# Patient Record
Sex: Female | Born: 1950
Health system: Southern US, Community
[De-identification: ages and names within clinical notes are randomized; demographics above are authoritative.]

## PROBLEM LIST (undated history)

## (undated) DIAGNOSIS — E039 Hypothyroidism, unspecified: Secondary | ICD-10-CM

## (undated) DIAGNOSIS — F419 Anxiety disorder, unspecified: Secondary | ICD-10-CM

## (undated) DIAGNOSIS — K219 Gastro-esophageal reflux disease without esophagitis: Secondary | ICD-10-CM

## (undated) DIAGNOSIS — R0683 Snoring: Secondary | ICD-10-CM

## (undated) DIAGNOSIS — M1990 Unspecified osteoarthritis, unspecified site: Secondary | ICD-10-CM

## (undated) DIAGNOSIS — K449 Diaphragmatic hernia without obstruction or gangrene: Secondary | ICD-10-CM

## (undated) DIAGNOSIS — E042 Nontoxic multinodular goiter: Secondary | ICD-10-CM

## (undated) DIAGNOSIS — L920 Granuloma annulare: Secondary | ICD-10-CM

## (undated) DIAGNOSIS — L719 Rosacea, unspecified: Secondary | ICD-10-CM

## (undated) DIAGNOSIS — K5730 Diverticulosis of large intestine without perforation or abscess without bleeding: Secondary | ICD-10-CM

## (undated) DIAGNOSIS — K5790 Diverticulosis of intestine, part unspecified, without perforation or abscess without bleeding: Secondary | ICD-10-CM

## (undated) DIAGNOSIS — Z800 Family history of malignant neoplasm of digestive organs: Secondary | ICD-10-CM

## (undated) DIAGNOSIS — L409 Psoriasis, unspecified: Secondary | ICD-10-CM

## (undated) DIAGNOSIS — E785 Hyperlipidemia, unspecified: Secondary | ICD-10-CM

## (undated) DIAGNOSIS — M797 Fibromyalgia: Secondary | ICD-10-CM

## (undated) HISTORY — DX: Anxiety disorder, unspecified: F41.9

## (undated) HISTORY — PX: TUBAL LIGATION: SHX77

## (undated) HISTORY — DX: Family history of malignant neoplasm of digestive organs: Z80.0

## (undated) HISTORY — DX: Diverticulosis of intestine, part unspecified, without perforation or abscess without bleeding: K57.90

## (undated) HISTORY — PX: TONSILLECTOMY: SUR1361

## (undated) HISTORY — DX: Rosacea, unspecified: L71.9

## (undated) HISTORY — DX: Diaphragmatic hernia without obstruction or gangrene: K44.9

## (undated) HISTORY — DX: Granuloma annulare: L92.0

## (undated) HISTORY — PX: KNEE SURGERY: SHX244

## (undated) HISTORY — DX: Hyperlipidemia, unspecified: E78.5

## (undated) HISTORY — DX: Hypothyroidism, unspecified: E03.9

## (undated) HISTORY — DX: Nontoxic multinodular goiter: E04.2

## (undated) HISTORY — DX: Psoriasis, unspecified: L40.9

## (undated) HISTORY — PX: WRIST SURGERY: SHX841

## (undated) HISTORY — DX: Fibromyalgia: M79.7

## (undated) HISTORY — DX: Unspecified osteoarthritis, unspecified site: M19.90

## (undated) HISTORY — DX: Diverticulosis of large intestine without perforation or abscess without bleeding: K57.30

---

## 1997-10-25 ENCOUNTER — Other Ambulatory Visit: Admission: RE | Admit: 1997-10-25 | Discharge: 1997-10-25 | Payer: Self-pay | Admitting: Dermatology

## 1997-10-31 ENCOUNTER — Other Ambulatory Visit: Admission: RE | Admit: 1997-10-31 | Discharge: 1997-10-31 | Payer: Self-pay | Admitting: Dermatology

## 1998-09-24 ENCOUNTER — Other Ambulatory Visit: Admission: RE | Admit: 1998-09-24 | Discharge: 1998-09-24 | Payer: Self-pay | Admitting: Gynecology

## 1999-03-25 ENCOUNTER — Ambulatory Visit (HOSPITAL_COMMUNITY): Admission: RE | Admit: 1999-03-25 | Discharge: 1999-03-25 | Payer: Self-pay | Admitting: Gastroenterology

## 1999-03-25 ENCOUNTER — Encounter: Payer: Self-pay | Admitting: Gastroenterology

## 1999-10-17 ENCOUNTER — Other Ambulatory Visit: Admission: RE | Admit: 1999-10-17 | Discharge: 1999-10-17 | Payer: Self-pay | Admitting: Gynecology

## 2000-01-13 ENCOUNTER — Encounter: Payer: Self-pay | Admitting: Gynecology

## 2000-01-13 ENCOUNTER — Encounter: Admission: RE | Admit: 2000-01-13 | Discharge: 2000-01-13 | Payer: Self-pay | Admitting: Gynecology

## 2000-01-22 ENCOUNTER — Ambulatory Visit (HOSPITAL_COMMUNITY): Admission: RE | Admit: 2000-01-22 | Discharge: 2000-01-22 | Payer: Self-pay | Admitting: Internal Medicine

## 2000-01-22 ENCOUNTER — Encounter: Payer: Self-pay | Admitting: Internal Medicine

## 2000-04-28 ENCOUNTER — Encounter: Payer: Self-pay | Admitting: Gastroenterology

## 2000-10-26 ENCOUNTER — Ambulatory Visit (HOSPITAL_COMMUNITY): Admission: RE | Admit: 2000-10-26 | Discharge: 2000-10-26 | Payer: Self-pay | Admitting: Orthopaedic Surgery

## 2000-10-26 ENCOUNTER — Encounter: Payer: Self-pay | Admitting: Orthopaedic Surgery

## 2000-11-10 ENCOUNTER — Other Ambulatory Visit: Admission: RE | Admit: 2000-11-10 | Discharge: 2000-11-10 | Payer: Self-pay | Admitting: Gynecology

## 2000-11-12 ENCOUNTER — Encounter: Payer: Self-pay | Admitting: Gynecology

## 2000-11-12 ENCOUNTER — Ambulatory Visit (HOSPITAL_COMMUNITY): Admission: RE | Admit: 2000-11-12 | Discharge: 2000-11-12 | Payer: Self-pay | Admitting: Gynecology

## 2001-01-18 ENCOUNTER — Encounter (HOSPITAL_COMMUNITY): Admission: RE | Admit: 2001-01-18 | Discharge: 2001-02-17 | Payer: Self-pay | Admitting: Orthopedic Surgery

## 2001-02-01 ENCOUNTER — Encounter: Payer: Self-pay | Admitting: Gynecology

## 2001-02-01 ENCOUNTER — Encounter: Admission: RE | Admit: 2001-02-01 | Discharge: 2001-02-01 | Payer: Self-pay | Admitting: Gynecology

## 2001-12-23 ENCOUNTER — Other Ambulatory Visit: Admission: RE | Admit: 2001-12-23 | Discharge: 2001-12-23 | Payer: Self-pay | Admitting: Gynecology

## 2002-03-02 ENCOUNTER — Encounter: Admission: RE | Admit: 2002-03-02 | Discharge: 2002-03-02 | Payer: Self-pay | Admitting: Gynecology

## 2002-03-02 ENCOUNTER — Encounter: Payer: Self-pay | Admitting: Gynecology

## 2002-12-27 ENCOUNTER — Other Ambulatory Visit: Admission: RE | Admit: 2002-12-27 | Discharge: 2002-12-27 | Payer: Self-pay | Admitting: Gynecology

## 2003-04-26 ENCOUNTER — Encounter: Admission: RE | Admit: 2003-04-26 | Discharge: 2003-04-26 | Payer: Self-pay | Admitting: Gynecology

## 2003-12-28 ENCOUNTER — Other Ambulatory Visit: Admission: RE | Admit: 2003-12-28 | Discharge: 2003-12-28 | Payer: Self-pay | Admitting: Gynecology

## 2004-01-04 ENCOUNTER — Encounter: Admission: RE | Admit: 2004-01-04 | Discharge: 2004-01-04 | Payer: Self-pay | Admitting: Endocrinology

## 2004-02-01 ENCOUNTER — Encounter (INDEPENDENT_AMBULATORY_CARE_PROVIDER_SITE_OTHER): Payer: Self-pay | Admitting: *Deleted

## 2004-02-01 ENCOUNTER — Ambulatory Visit (HOSPITAL_COMMUNITY): Admission: RE | Admit: 2004-02-01 | Discharge: 2004-02-01 | Payer: Self-pay | Admitting: Endocrinology

## 2004-05-08 ENCOUNTER — Encounter: Admission: RE | Admit: 2004-05-08 | Discharge: 2004-05-08 | Payer: Self-pay | Admitting: Gynecology

## 2004-06-25 ENCOUNTER — Ambulatory Visit: Payer: Self-pay | Admitting: Endocrinology

## 2004-06-27 ENCOUNTER — Ambulatory Visit: Payer: Self-pay | Admitting: Endocrinology

## 2004-06-28 ENCOUNTER — Ambulatory Visit: Payer: Self-pay | Admitting: Internal Medicine

## 2004-07-17 ENCOUNTER — Ambulatory Visit: Payer: Self-pay | Admitting: Endocrinology

## 2004-11-14 ENCOUNTER — Ambulatory Visit: Payer: Self-pay | Admitting: Endocrinology

## 2004-11-18 ENCOUNTER — Ambulatory Visit: Payer: Self-pay | Admitting: Endocrinology

## 2004-12-11 ENCOUNTER — Ambulatory Visit: Payer: Self-pay | Admitting: Endocrinology

## 2004-12-18 ENCOUNTER — Ambulatory Visit (HOSPITAL_COMMUNITY): Admission: RE | Admit: 2004-12-18 | Discharge: 2004-12-18 | Payer: Self-pay | Admitting: Endocrinology

## 2004-12-20 ENCOUNTER — Ambulatory Visit: Payer: Self-pay | Admitting: Endocrinology

## 2005-01-02 ENCOUNTER — Other Ambulatory Visit: Admission: RE | Admit: 2005-01-02 | Discharge: 2005-01-02 | Payer: Self-pay | Admitting: Gynecology

## 2005-01-07 ENCOUNTER — Ambulatory Visit: Payer: Self-pay | Admitting: Gastroenterology

## 2005-01-29 ENCOUNTER — Ambulatory Visit: Payer: Self-pay | Admitting: Gastroenterology

## 2005-03-11 ENCOUNTER — Ambulatory Visit: Payer: Self-pay | Admitting: Gastroenterology

## 2005-05-02 ENCOUNTER — Emergency Department (HOSPITAL_COMMUNITY): Admission: EM | Admit: 2005-05-02 | Discharge: 2005-05-02 | Payer: Self-pay | Admitting: Emergency Medicine

## 2005-06-04 ENCOUNTER — Encounter: Admission: RE | Admit: 2005-06-04 | Discharge: 2005-06-04 | Payer: Self-pay | Admitting: Gynecology

## 2005-08-12 ENCOUNTER — Ambulatory Visit: Payer: Self-pay | Admitting: Endocrinology

## 2005-08-12 ENCOUNTER — Ambulatory Visit (HOSPITAL_COMMUNITY): Admission: RE | Admit: 2005-08-12 | Discharge: 2005-08-12 | Payer: Self-pay | Admitting: Endocrinology

## 2005-10-15 ENCOUNTER — Ambulatory Visit: Payer: Self-pay | Admitting: Endocrinology

## 2005-10-21 ENCOUNTER — Ambulatory Visit: Payer: Self-pay | Admitting: Endocrinology

## 2006-07-02 ENCOUNTER — Encounter: Admission: RE | Admit: 2006-07-02 | Discharge: 2006-07-02 | Payer: Self-pay | Admitting: Gynecology

## 2007-01-23 ENCOUNTER — Encounter: Payer: Self-pay | Admitting: Endocrinology

## 2007-01-23 DIAGNOSIS — M810 Age-related osteoporosis without current pathological fracture: Secondary | ICD-10-CM | POA: Insufficient documentation

## 2007-01-23 DIAGNOSIS — E039 Hypothyroidism, unspecified: Secondary | ICD-10-CM

## 2007-03-02 ENCOUNTER — Ambulatory Visit: Payer: Self-pay | Admitting: Gastroenterology

## 2007-04-05 ENCOUNTER — Ambulatory Visit: Payer: Self-pay | Admitting: Gastroenterology

## 2007-04-05 DIAGNOSIS — K5730 Diverticulosis of large intestine without perforation or abscess without bleeding: Secondary | ICD-10-CM | POA: Insufficient documentation

## 2007-07-05 ENCOUNTER — Encounter: Admission: RE | Admit: 2007-07-05 | Discharge: 2007-07-05 | Payer: Self-pay | Admitting: Gynecology

## 2007-08-23 DIAGNOSIS — F411 Generalized anxiety disorder: Secondary | ICD-10-CM | POA: Insufficient documentation

## 2007-08-23 DIAGNOSIS — E785 Hyperlipidemia, unspecified: Secondary | ICD-10-CM

## 2007-12-29 ENCOUNTER — Ambulatory Visit: Payer: Self-pay | Admitting: Internal Medicine

## 2007-12-29 DIAGNOSIS — S93409A Sprain of unspecified ligament of unspecified ankle, initial encounter: Secondary | ICD-10-CM | POA: Insufficient documentation

## 2008-02-15 ENCOUNTER — Ambulatory Visit: Payer: Self-pay | Admitting: Internal Medicine

## 2008-02-15 DIAGNOSIS — J40 Bronchitis, not specified as acute or chronic: Secondary | ICD-10-CM

## 2008-02-15 DIAGNOSIS — K219 Gastro-esophageal reflux disease without esophagitis: Secondary | ICD-10-CM

## 2008-05-29 ENCOUNTER — Ambulatory Visit: Payer: Self-pay | Admitting: Internal Medicine

## 2008-05-29 DIAGNOSIS — S52539A Colles' fracture of unspecified radius, initial encounter for closed fracture: Secondary | ICD-10-CM

## 2008-06-05 ENCOUNTER — Encounter: Payer: Self-pay | Admitting: Internal Medicine

## 2008-06-08 ENCOUNTER — Ambulatory Visit (HOSPITAL_COMMUNITY): Admission: RE | Admit: 2008-06-08 | Discharge: 2008-06-08 | Payer: Self-pay | Admitting: Orthopaedic Surgery

## 2008-06-16 ENCOUNTER — Encounter: Payer: Self-pay | Admitting: Internal Medicine

## 2008-06-16 ENCOUNTER — Ambulatory Visit: Payer: Self-pay | Admitting: Internal Medicine

## 2008-06-16 DIAGNOSIS — R930 Abnormal findings on diagnostic imaging of skull and head, not elsewhere classified: Secondary | ICD-10-CM | POA: Insufficient documentation

## 2008-06-16 LAB — CONVERTED CEMR LAB
Inflenza A Ag: NEGATIVE
Influenza B Ag: NEGATIVE

## 2008-06-20 ENCOUNTER — Telehealth (INDEPENDENT_AMBULATORY_CARE_PROVIDER_SITE_OTHER): Payer: Self-pay | Admitting: *Deleted

## 2008-07-17 ENCOUNTER — Encounter: Admission: RE | Admit: 2008-07-17 | Discharge: 2008-07-17 | Payer: Self-pay | Admitting: Gynecology

## 2008-07-31 ENCOUNTER — Encounter: Payer: Self-pay | Admitting: Internal Medicine

## 2008-07-31 ENCOUNTER — Ambulatory Visit: Payer: Self-pay | Admitting: Internal Medicine

## 2009-01-30 ENCOUNTER — Encounter: Payer: Self-pay | Admitting: Gastroenterology

## 2009-03-06 ENCOUNTER — Ambulatory Visit: Payer: Self-pay | Admitting: Internal Medicine

## 2009-03-07 ENCOUNTER — Encounter: Payer: Self-pay | Admitting: Internal Medicine

## 2009-06-14 ENCOUNTER — Encounter: Payer: Self-pay | Admitting: Gastroenterology

## 2009-06-21 ENCOUNTER — Ambulatory Visit: Payer: Self-pay | Admitting: Gastroenterology

## 2009-06-21 DIAGNOSIS — R740 Nonspecific elevation of levels of transaminase and lactic acid dehydrogenase [LDH]: Secondary | ICD-10-CM

## 2009-06-21 DIAGNOSIS — K760 Fatty (change of) liver, not elsewhere classified: Secondary | ICD-10-CM

## 2009-06-21 LAB — CONVERTED CEMR LAB
AFP-Tumor Marker: 4.4 ng/mL (ref 0.0–8.0)
ANA Titer 1: NEGATIVE
Anti Nuclear Antibody(ANA): POSITIVE — AB
Ceruloplasmin: 45 mg/dL (ref 21–63)
Ferritin: 114.2 ng/mL (ref 10.0–291.0)
HCV Ab: NEGATIVE
Hepatitis B Surface Ag: NEGATIVE
INR: 1 (ref 0.8–1.0)
Iron: 58 ug/dL (ref 42–145)
Prothrombin Time: 10.3 s (ref 9.1–11.7)
Saturation Ratios: 18.6 % — ABNORMAL LOW (ref 20.0–50.0)
Sed Rate: 17 mm/hr (ref 0–22)
Transferrin: 222.7 mg/dL (ref 212.0–360.0)
Vitamin B-12: 572 pg/mL (ref 211–911)

## 2009-07-23 ENCOUNTER — Encounter: Admission: RE | Admit: 2009-07-23 | Discharge: 2009-07-23 | Payer: Self-pay | Admitting: Gynecology

## 2009-09-14 ENCOUNTER — Encounter: Payer: Self-pay | Admitting: Gastroenterology

## 2010-06-29 ENCOUNTER — Other Ambulatory Visit: Payer: Self-pay | Admitting: Gynecology

## 2010-06-29 DIAGNOSIS — Z1239 Encounter for other screening for malignant neoplasm of breast: Secondary | ICD-10-CM

## 2010-07-09 NOTE — Assessment & Plan Note (Signed)
Summary: INCREASED LIVER FUNCTION TEST DUE TO CRESTOR/YF    History of Present Illness Visit Type: consult  Primary GI MD: Sheryn Bison MD FACP FAGA Primary Provider: Herb Grays, MD  Requesting Provider: Herb Grays, MD  Chief Complaint: Increased LFT  History of Present Illness:   This patient is a 60 year old white female with a known benign liver hemangioma followed the last several years with CT scans. She has had normal liver function tests until this past summer when her liver enzymes were approximately twice normal. Crestor was discontinued and she continues to have liver transaminases twice normal with normal bilirubin and alkaline phosphatase. Repeat ultrasound was performed in August which showed hepatic steatosis and a stable hepatic hemangioma. The gallbladder appeared normal. has a variety of skin lesions including granuloma annulare and psoriasis. Review of liver function tests over the last 10 years she has no abnormalities. Previous evaluation for Lynch syndrome was negative, she is HLA-B. 27 negative and had a negative rheumatoid factor and ANA. She does have a very positive family history of colon cancer. Her previous workups are negative for coronary artery disease. She does have a history of a multinodular goiter. Workup for hyperparathyroidism has apparently been negative.  The patient denies any history of liver disease or hepatitis or familial liver or GI problems. She does suffer from obesity all her life and has regained 40 pounds in weight over the last year. She has borderline glucose intolerance, hyperlipidemia, chronic lower dysfunction, chronic GERD requiring PPI therapy, and she takes daily Lasix and Valtrex. She denies right upper quadrant pain, nausea and vomiting, or any lower GI complaints. She is up-to-date on colonoscopy exams. She does not abuse ethanol. She specifically denies pruritus, chronic fatigue, mental status changes, abdominal swelling or edema. She  gives no history of previous hepatitis or pancreatitis.   GI Review of Systems    Reports weight gain.      Denies abdominal pain, acid reflux, belching, bloating, chest pain, dysphagia with liquids, dysphagia with solids, heartburn, loss of appetite, nausea, vomiting, vomiting blood, and  weight loss.        Denies anal fissure, black tarry stools, change in bowel habit, constipation, diarrhea, diverticulosis, fecal incontinence, heme positive stool, hemorrhoids, irritable bowel syndrome, jaundice, light color stool, liver problems, rectal bleeding, and  rectal pain.    Current Medications (verified): 1)  Synthroid 88 Mcg  Tabs (Levothyroxine Sodium) .... Take 1 By Mouth Qd 2)  Adult Aspirin Low Strength 81 Mg  Tbdp (Aspirin) .... Take 1 By Mouth Qd 3)  Buspar 15 Mg  Tabs (Buspirone Hcl) .... Take 1 By Mouth Two Times A Day Qd 4)  Calcium 500/d 500-200 Mg-Unit  Tabs (Calcium Carbonate-Vitamin D) .... Take 1 By Mouth Qd 5)  Multivitamins   Tabs (Multiple Vitamin) .... Take 1 By Mouth Qd 6)  Folic Acid 1 Mg  Tabs (Folic Acid) .... Take 1 By Mouth Qd 7)  Protonix 40 Mg  Tbec (Pantoprazole Sodium) .... Take 1 By Mouth Two Times A Day Qd 8)  Lasix 20 Mg  Tabs (Furosemide) .... Take 1 By Mouth Once Daily As Needed 9)  Valtrex 1 Gm  Tabs (Valacyclovir Hcl) .... Take 1 By Mouth Once Daily Prn  Allergies (verified): 1)  ! Relafen 2)  ! Erythromycin  Past History:  Past medical, surgical, family and social histories (including risk factors) reviewed for relevance to current acute and chronic problems.  Past Medical History: Reviewed history from 06/16/2008 and no changes  required. Hypothyroidism (1993) Osteoporosis (2002) Rosacea (2002) Annulary Granuloma (2002) Psoriasis (2002) Dyslipidemia Multinodular Goiter Fibromyalgia Anxiety  Past Surgical History: Reviewed history from 08/23/2007 and no changes required. EGD (01/29/2005) Echocardiogram (01/31/2004) Rest/Stress Cardiolite  (01/31/2004) EKG (02/06/2004) Tubal ligation Knee surgery  Family History: Reviewed history and no changes required. Family History of Colon Cancer:Father   Social History: Reviewed history from 06/16/2008 and no changes required. Occupation: [employer] Never Smoked Alcohol use-no Drug use-no Regular exercise-no  Review of Systems  The patient denies allergy/sinus, anemia, anxiety-new, arthritis/joint pain, back pain, blood in urine, breast changes/lumps, change in vision, confusion, cough, coughing up blood, depression-new, fainting, fatigue, fever, headaches-new, hearing problems, heart murmur, heart rhythm changes, itching, menstrual pain, muscle pains/cramps, night sweats, nosebleeds, pregnancy symptoms, shortness of breath, skin rash, sleeping problems, sore throat, swelling of feet/legs, swollen lymph glands, thirst - excessive , urination - excessive , urination changes/pain, urine leakage, vision changes, and voice change.    Vital Signs:  Patient profile:   60 year old female Height:      68 inches Weight:      253 pounds BMI:     38.61 BSA:     2.26 Pulse rate:   68 / minute Pulse rhythm:   regular BP sitting:   128 / 80  (left arm) Cuff size:   regular  Vitals Entered By: Ok Anis CMA (June 21, 2009 11:11 AM)  Physical Exam  General:  Well developed, well nourished, no acute distress.healthy appearing and obese.   Head:  Normocephalic and atraumatic. Eyes:  PERRLA, no icterus.exam deferred to patient's ophthalmologist.   Lungs:  Clear throughout to auscultation. Heart:  Regular rate and rhythm; no murmurs, rubs,  or bruits. Abdomen:  Soft, nontender and nondistended. No masses, hepatosplenomegaly or hernias noted. Normal bowel sounds.obese.  There is no hepatomegaly noted to percussion and palpitation. Cannot appreciate stigmata of chronic liver disease. She does have a skin rash of granuloma annulare over entire trunk area. Rectal:  deferred Extremities:  No  clubbing, cyanosis, edema or deformities noted. Neurologic:  Alert and  oriented x4;  grossly normal neurologically. Skin:  maculopapular rash:Marland Kitchen   Psych:  Alert and cooperative. Normal mood and affect.   Impression & Recommendations:  Problem # 1:  FATTY LIVER DISEASE (ICD-571.8) Assessment Deteriorated Labs ordered to exclude other metabolic causes of liver disease. 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 I placed her on Actos 30 mg a day along with vitamin E 400 units a day along with reinforcement per a gradual low-calorie, low carbohydrate weight loss program with aerobic exercise. I will see her back in 2 months time with liver profile before her visit. We will hold her Crestor for now. I've given her patient education material concerning fatty liver and its management. Depending on her workup and clinical course she may need percutaneous liver biopsy. I reviewed her recent ultrasound report and her hepatic hemangioma is small and stable. Alpha-fetoprotein level has been ordered. TLB-B12, Serum-Total ONLY ([address]-U04) TLB-Ferritin (82728-FER) TLB-Iron, (Fe) Total (83540-FE) TLB-IBC Pnl (Iron/FE;Transferrin) (83550-IBC) TLB-Sedimentation Rate (ESR) (85652-ESR) TLB-PT (Protime) (85610-PTP) T-AMA (54098-11914) T-ANA (78295-62130) T-Ceruloplasmin [phone]) T-Hepatitis B Surface Antigen (95284-13244) T-Hepatitis C Anti HCV (01027) T-Alpha-Fetoprotein Serum (25366-44034)  Problem # 2:  ADENOCARCINOMA, COLON, FAMILY HX (ICD-V16.0) Assessment: Unchanged followup as per clinical protocol  Problem # 3:  GERD (ICD-530.81) Assessment: Improved continue reflex regime and daily PPI therapy.  Patient Instructions: 1)  Copy sent  to : Dr. Corrin [name] and Dr. Herb Grays 2)  Please continue current medications.  3)  Please schedule a follow-up appointment in 2 months. 4)  Liver profile before next clinic  visit. 5)  Rx sent to pharmacy for Actos 30 mg qd.   6)  Pt will buy Vit E OTC. 7)  The medication list was reviewed and reconciled.  All changed / newly prescribed medications were explained.  A complete medication list was provided to the patient / caregiver. Prescriptions: ACTOS 30 MG TABS (PIOGLITAZONE HCL) 1 by mouth q am  #30 x 6   Entered by:   Ashok Cordia RN   Authorized by:   Mardella Layman MD [facility]   Signed by:   Ashok Cordia RN on 06/21/2009   Method used:   Electronically to        [facility]* (retail)       [address]       [address] Shipping/mailing       [place], [place]  [address]       Ph: [phone]       Fax: [phone]   RxID:   [id number]

## 2010-07-29 ENCOUNTER — Ambulatory Visit
Admission: RE | Admit: 2010-07-29 | Discharge: 2010-07-29 | Disposition: A | Discharge status: Home / Self Care | Payer: Commercial Managed Care - PPO | Source: Ambulatory Visit | Attending: Gynecology | Admitting: Gynecology

## 2010-07-29 DIAGNOSIS — Z1239 Encounter for other screening for malignant neoplasm of breast: Secondary | ICD-10-CM

## 2010-10-22 NOTE — Assessment & Plan Note (Signed)
[facility]                         GASTROENTEROLOGY OFFICE NOTE   NAME:[name], [name] BACCARI                       MRN:          [id number]  DATE:03/02/2007                            DOB:          [birth year]    [name] [name] is referred by Dr. Collins Scotland for evaluation of abnormal CT scan of the  abdomen performed on January 29, 2007 because of right lower quadrant  discomfort.  This showed a 3.8 x 3.7 x 3 cm benign hemangioma with  otherwise normal CT scan of the abdomen and pelvis.   [name] [name] has had a musculoskeletal type of right lower quadrant pain for  the last several weeks without real change in her bowel habits.  She has  been chronically constipated and has to use laxatives.  She denies  melena, hematochezia, or any hepatobiliary or upper gastrointestinal  complaints.  I have seen her in the past because of acid reflux, and she  had endoscopy in 2005-01-26 and colonoscopy in 01/27/2003.  Her  father died of colon cancer at age 28 and her grandmother at age 51.   Her past medical history is remarkable for removal of a previous fatty  tumor of her back by Dr. Mitzi Davenport.  She is followed by Dr. 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  She  additionally suffers from hyperlipidemia, chronic thyroid disorder, and  GERD, as mentioned above.  She has had 9 cardiac chest pain with a  previous negative cardiac workup, including stress Cardiolite, most  recently in 2005.  She has been screened in the past for hereditary  nonpolyposis colorectal cancer, and this was negative in 2005.  She also  in the past on reviewing her chart is suffering from plantar fascitis  and sees Dr. Norlene Campbell.   She is on a variety of medications that are listed and reviewed in her  chart, including Protonix, Synthroid, aspirin, Crestor, and BuSpar,  along with Metamucil and a variety of dietary supplements.  Her  Synthroid is 0.88 mcg a day.   She  is followed, rheumatology-wise, by Dr. Corliss Skains and sees Dr. Shelby Mattocks in dermatology, and Dr. Greta Doom for GYN.   On review of her chart, I cannot find the previous ultrasound or CT  scans of her abdomen in her record.  She relates that she recently saw  Dr. Collins Scotland and had normal liver function tests and is due for follow-up  lab data this coming week.   PHYSICAL EXAMINATION:  Exam shows her to be a middle-aged white female  appearing her stated age in no acute distress.  She weighs 228 pounds.  Blood pressure is 112/62, pulse 68 and regular.  I could not appreciate any hepatosplenomegaly, abdominal masses, or  localized areas of tenderness.  Bowel sounds were normal.  RECTAL:  Not performed.   ASSESSMENT:  1. [name] [name] has chronic functional constipation and vague right      lower quadrant discomfort, which really does sound more      musculoskeletal than bowel-related; however, she has a strong  family history of colon carcinoma, and she is due for follow-up      exam.  2. Benign hepatic hemangioma, which I do not think requires further      evaluation at this time but would repeat ultrasound or CT exam in      six months.  If not done, liver function tests need to be      completed.  3. Multiple medical problems, as mentioned above:  Managed by Dr.      Collins Scotland.   RECOMMENDATIONS:  1. A trial of Amitiza 8 mcg twice daily for constipation.  2. Follow-up colonoscopy exam.  3. Continue other multiple medications, as listed above.     Vania Rea. Jarold Motto, MD, [name], FAGA  Electronically Signed    DRP/MedQ  DD: 03/02/2007  DT: 03/03/2007  Job #: [id number]   cc:   Tammy R. Collins Scotland, M.D.

## 2010-10-23 IMAGING — CR DG CHEST 2V
2 series · 2 of 2 positions shown · non-contrast
Comparison: Digitized films from 09/14/01.

CLINICAL DATA: Cough.

CHEST - 2 VIEW

[view not recorded (1 of 2)]
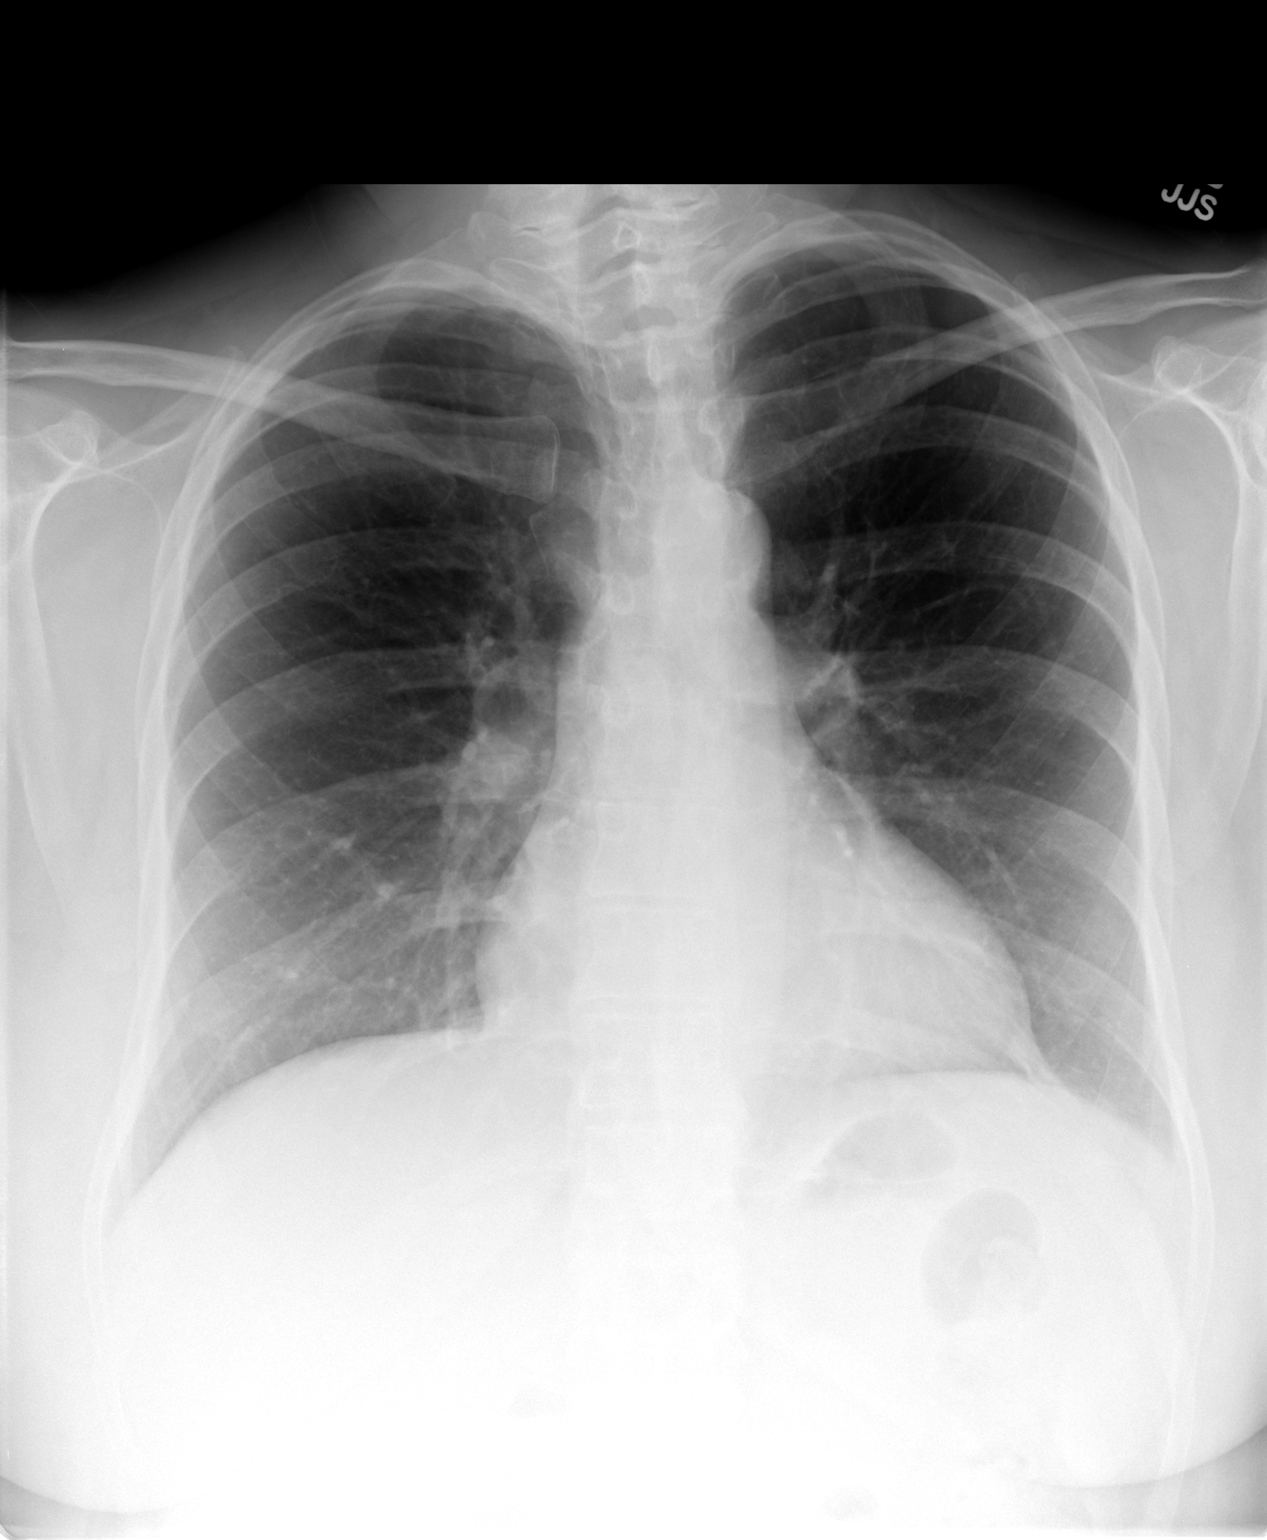

[view not recorded (2 of 2)]
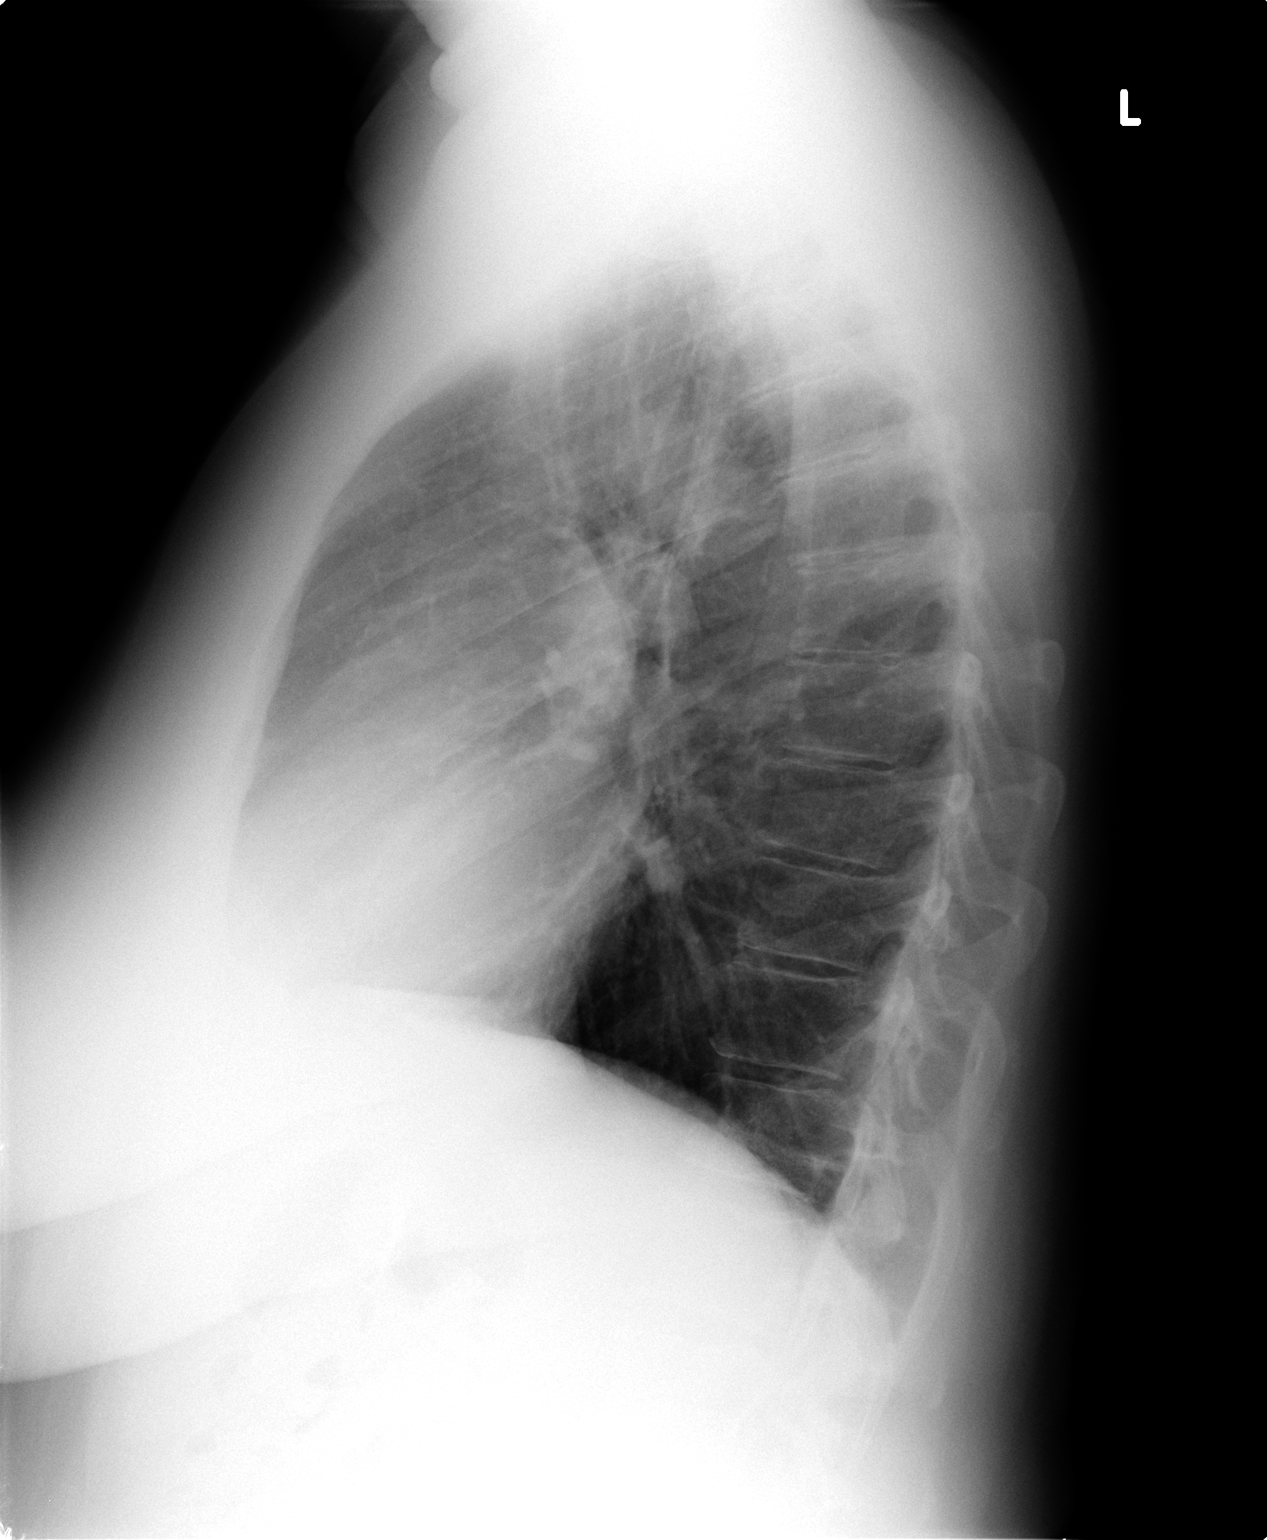

[2 of 2 positions shown; findings below may reference images not displayed]

FINDINGS: The lungs are clear without focal infiltrate, edema,
pneumothorax or pleural effusion. The cardiopericardial silhouette
is within normal limits for size. Imaged bony structures of the
thorax are intact.
IMPRESSION: No acute cardiopulmonary process.

## 2011-02-21 ENCOUNTER — Ambulatory Visit: Payer: Commercial Managed Care - PPO | Admitting: Internal Medicine

## 2011-02-26 ENCOUNTER — Ambulatory Visit: Payer: Commercial Managed Care - PPO | Admitting: Internal Medicine

## 2011-05-02 ENCOUNTER — Ambulatory Visit (INDEPENDENT_AMBULATORY_CARE_PROVIDER_SITE_OTHER): Payer: Commercial Managed Care - PPO | Admitting: Internal Medicine

## 2011-05-02 VITALS — BP 136/82 | HR 85 | Temp 98.6°F | Wt 239.0 lb

## 2011-05-02 DIAGNOSIS — J069 Acute upper respiratory infection, unspecified: Secondary | ICD-10-CM

## 2011-05-02 MED ORDER — AZITHROMYCIN 250 MG PO TABS: ORAL_TABLET | ORAL | Status: AC

## 2011-05-02 MED ORDER — PROMETHAZINE-CODEINE 6.25-10 MG/5ML PO SYRP: 5.0000 mL | ORAL_SOLUTION | ORAL | Status: AC | PRN

## 2011-05-02 NOTE — Progress Notes (Signed)
  Subjective:    Patient ID: [name] [name], female    DOB: 1950/07/26, 60 y.o.   MRN: [id number]  HPI   HPI  C/o URI sx's x 2   days. C/o ST, cough, weakness. Not better with OTC medicines. Actually, the patient is getting worse. The patient did not sleep last night due to cough.  Review of Systems  Constitutional: Positive for fever, chills and fatigue.  HENT: Positive for congestion, rhinorrhea, sneezing and postnasal drip.   Eyes: Positive for photophobia and pain. Negative for discharge and visual disturbance.  Respiratory: Positive for cough and Gastrointestinal: Negative for vomiting, abdominal pain, diarrhea and abdominal distention.  Genitourinary: Negative for dysuria and difficulty urinating.  Skin: Negative for rash.  Neurological: Positive for dizziness, weakness and light-headedness.      Review of Systems     Objective:   Physical Exam  Constitutional: She appears well-developed. No distress.  HENT:  Head: Normocephalic.  Right Ear: External ear normal.  Left Ear: External ear normal.  Nose: Nose normal.       eryth throat  Eyes: Conjunctivae are normal. Pupils are equal, round, and reactive to light. Right eye exhibits no discharge. Left eye exhibits no discharge.  Neck: Normal range of motion. Neck supple. No JVD present. No tracheal deviation present. No thyromegaly present.  Cardiovascular: Normal rate, regular rhythm and normal heart sounds.   Pulmonary/Chest: No stridor. No respiratory distress. She has no wheezes.  Abdominal: Soft. Bowel sounds are normal. She exhibits no distension and no mass. There is no tenderness. There is no rebound and no guarding.  Musculoskeletal: She exhibits no edema and no tenderness.  Lymphadenopathy:    She has no cervical adenopathy.  Neurological: She displays normal reflexes. No cranial nerve deficit. She exhibits normal muscle tone. Coordination normal.  Skin: No rash noted. No erythema.  Psychiatric: She has a normal  mood and affect. Her behavior is normal. Judgment and thought content normal.          Assessment & Plan:

## 2011-05-02 NOTE — Assessment & Plan Note (Signed)
See Meds: take Zpac if worse

## 2011-05-06 ENCOUNTER — Ambulatory Visit: Payer: Commercial Managed Care - PPO | Admitting: Gastroenterology

## 2011-06-10 HISTORY — PX: UPPER GASTROINTESTINAL ENDOSCOPY: SHX188

## 2011-06-10 HISTORY — PX: COLONOSCOPY: SHX174

## 2011-06-25 ENCOUNTER — Other Ambulatory Visit: Payer: Self-pay | Admitting: Gynecology

## 2011-06-25 DIAGNOSIS — Z1231 Encounter for screening mammogram for malignant neoplasm of breast: Secondary | ICD-10-CM

## 2011-07-08 ENCOUNTER — Other Ambulatory Visit: Payer: Self-pay | Admitting: Dermatology

## 2011-08-04 ENCOUNTER — Ambulatory Visit
Admission: RE | Admit: 2011-08-04 | Discharge: 2011-08-04 | Disposition: A | Discharge status: Home / Self Care | Payer: 59 | Source: Ambulatory Visit | Attending: Gynecology | Admitting: Gynecology

## 2011-08-04 DIAGNOSIS — Z1231 Encounter for screening mammogram for malignant neoplasm of breast: Secondary | ICD-10-CM

## 2011-11-05 ENCOUNTER — Encounter: Payer: Self-pay | Admitting: *Deleted

## 2011-11-11 ENCOUNTER — Encounter: Payer: Self-pay | Admitting: Gastroenterology

## 2011-11-11 ENCOUNTER — Ambulatory Visit (INDEPENDENT_AMBULATORY_CARE_PROVIDER_SITE_OTHER): Payer: 59 | Admitting: Gastroenterology

## 2011-11-11 VITALS — BP 100/72 | HR 76 | Ht 68.0 in | Wt 245.1 lb

## 2011-11-11 DIAGNOSIS — Z800 Family history of malignant neoplasm of digestive organs: Secondary | ICD-10-CM

## 2011-11-11 DIAGNOSIS — L409 Psoriasis, unspecified: Secondary | ICD-10-CM

## 2011-11-11 DIAGNOSIS — K7689 Other specified diseases of liver: Secondary | ICD-10-CM

## 2011-11-11 DIAGNOSIS — L408 Other psoriasis: Secondary | ICD-10-CM

## 2011-11-11 DIAGNOSIS — K760 Fatty (change of) liver, not elsewhere classified: Secondary | ICD-10-CM

## 2011-11-11 DIAGNOSIS — K219 Gastro-esophageal reflux disease without esophagitis: Secondary | ICD-10-CM

## 2011-11-11 DIAGNOSIS — K5730 Diverticulosis of large intestine without perforation or abscess without bleeding: Secondary | ICD-10-CM

## 2011-11-11 MED ORDER — MOVIPREP 100 G PO SOLR: 1.0000 | Freq: Once | ORAL | Status: DC

## 2011-11-11 NOTE — Patient Instructions (Signed)
Your procedure has been scheduled for 01/02/2012, please follow the seperate instructions.  Your prescription(s) have been sent to you pharmacy.  Please go to the basement today for your labs.

## 2011-11-11 NOTE — Progress Notes (Signed)
History of Present Illness:  This is a 61 year old Caucasian female with psoriasis, fatty liver, chronic GERD, and a strong family history of colon cancer. She is due for followup endoscopy and colonoscopy. The patient been on Prilosec 40 mg a day with fairly good control of her acid reflux symptoms. She has been treated for H. pylori in January of last year by Dr. Collins Scotland. Patient has a history of psoriasis, and apparently was contemplating Humira therapy, but her psoriasis has apparently resolved over the last year spontaneously. Repeat liver function test were reviewed today and have become normal, she has a normal sed rate, rheumatoid panel, and CBC. She has regular bowel movements without melena or hematochezia. Review of her radiographs show a benign liver hemangioma, fatty infiltration, and other causes of liver disease had been tested and have been negative with a normal serum alpha-fetoprotein level. The patient denies right upper quadrant pain or any hepatobiliary complaints at this time. Her appetite is good and her weight has been stable. She is to for hyporthyroidism ,and is chronically on Valtrex.  I have reviewed this patient's present history, medical and surgical past history, allergies and medications.     ROS: The remainder of the 10 point ROS is negative     Physical Exam: Blood pressure 100/72, pulse 76, weight 245 pounds the BMI of 37.27. I cannot appreciate stigmata of chronic liver disease. General well developed well nourished patient in no acute distress, appearing their stated age Eyes PERRLA, no icterus, fundoscopic exam per opthamologist Skin no lesions noted, some papular lesions on her left arm area, or certainly no diffuse psoriasis. Neck supple, no adenopathy, no thyroid enlargement, no tenderness Chest clear to percussion and auscultation Heart no significant murmurs, gallops or rubs noted Abdomen no hepatosplenomegaly masses or tenderness, BS normal.  Extremities  no acute joint lesions, edema, phlebitis or evidence of cellulitis. Neurologic patient oriented x 3, cranial nerves intact, no focal neurologic deficits noted. Psychological mental status normal and normal affect.  Assessment and plan: Chronic GERD doing well on PPI therapy. She desires endoscopy exclude Barrett's mucosa, and I have scheduled this time her for followup colonoscopy. Both her grandmother and her mother apparently had colon cancer at age 44. Previous genetic studies for hereditary colon cancer syndrome and was negative. I have ordered repeat serum alpha-fetoprotein level. This patient extremely concerned about her health, and seems very anxious and concerned about the possibility of occult malignancy. I reviewed anti-reflux regime with her, we'll continue her Prilosec, and high fiber diet as tolerated. Review of her numerous labs, records, and radiographs was extremely time consuming. Please copy this note to Dr. Herb Grays.  Encounter Diagnoses  Name Primary?  . Family history of malignant neoplasm of gastrointestinal tract Yes  . Esophageal reflux   . Diverticulosis of colon (without mention of hemorrhage)

## 2011-11-12 ENCOUNTER — Other Ambulatory Visit: Payer: 59

## 2011-11-12 DIAGNOSIS — K219 Gastro-esophageal reflux disease without esophagitis: Secondary | ICD-10-CM

## 2011-11-12 DIAGNOSIS — Z800 Family history of malignant neoplasm of digestive organs: Secondary | ICD-10-CM

## 2011-11-12 DIAGNOSIS — K5730 Diverticulosis of large intestine without perforation or abscess without bleeding: Secondary | ICD-10-CM

## 2011-11-20 ENCOUNTER — Other Ambulatory Visit: Payer: Self-pay

## 2011-12-15 ENCOUNTER — Ambulatory Visit (INDEPENDENT_AMBULATORY_CARE_PROVIDER_SITE_OTHER): Payer: 59 | Admitting: Internal Medicine

## 2011-12-15 ENCOUNTER — Encounter: Payer: Self-pay | Admitting: Internal Medicine

## 2011-12-15 VITALS — BP 130/72 | HR 67 | Temp 97.4°F | Ht 69.0 in | Wt 247.0 lb

## 2011-12-15 DIAGNOSIS — L920 Granuloma annulare: Secondary | ICD-10-CM | POA: Insufficient documentation

## 2011-12-15 DIAGNOSIS — R21 Rash and other nonspecific skin eruption: Secondary | ICD-10-CM

## 2011-12-15 DIAGNOSIS — L538 Other specified erythematous conditions: Secondary | ICD-10-CM

## 2011-12-15 DIAGNOSIS — L409 Psoriasis, unspecified: Secondary | ICD-10-CM

## 2011-12-15 DIAGNOSIS — L408 Other psoriasis: Secondary | ICD-10-CM

## 2011-12-15 DIAGNOSIS — R609 Edema, unspecified: Secondary | ICD-10-CM

## 2011-12-15 MED ORDER — FUROSEMIDE 20 MG PO TABS: 10.0000 mg | ORAL_TABLET | Freq: Every day | ORAL | Status: AC | PRN

## 2011-12-15 NOTE — Patient Instructions (Signed)
It was good to see you today. Use Lasix for next 3 days then as needed for swelling- Your prescription(s) have been submitted to your pharmacy. Please take as directed and contact our office if you believe you are having problem(s) with the medication(s). Keep feet elevated as able and call if increasing rash or other changes

## 2011-12-15 NOTE — Progress Notes (Signed)
  Subjective:    Patient ID: [name] [name], female    DOB: [birth year], 61 y.o.   MRN: [id number]  HPI  complains of rash Onset 3 days ago associated with intense but temporary swelling B feet Not associated with itch or blistering, not painful No other body affected with this rash Different than AG or psoriasis rash  Past Medical History  Diagnosis Date  . Hypothyroidism   . Osteoporosis   . Rosacea   . Granuloma annulare   . Psoriasis   . Dyslipidemia   . Multinodular goiter   . Fibromyalgia   . Anxiety   . Diverticulosis of colon (without mention of hemorrhage)   . Family history of malignant neoplasm of gastrointestinal tract      Review of Systems  Constitutional: Negative for fever, fatigue and unexpected weight change.  Respiratory: Negative for cough and shortness of breath.   Cardiovascular: Positive for leg swelling. Negative for chest pain and palpitations.  Hematological: Does not bruise/bleed easily.       Objective:   Physical Exam BP 130/72  Pulse 67  Temp 97.4 F (36.3 C) (Oral)  Ht 5\' 9"  (1.753 m)  Wt 247 lb (112.038 kg)  BMI 36.48 kg/m2  SpO2 96% Constitutional: She is overweight, nontoxic - appears well-developed and well-nourished. No distress.  Cardiovascular: Normal rate, regular rhythm and normal heart sounds.  No murmur heard. No BLE edema. Skin: scattered petechia distal BLE ob posterior calf/ankle, sparing feet - none above mid calf - other skin is warm and dry. AG rash noted on feet and psoriasis on BUE. No erythema.  Psychiatric: She has a normal mood and affect. Her behavior is normal. Judgment and thought content normal.    No results found for this basename: WBC, HGB, HCT, MCV, PLT       Assessment & Plan:  BLE edema, dependant with mild capillary damage  Resume lasix qd x 3days, then prn Reassurance provided

## 2011-12-25 LAB — HM COLONOSCOPY

## 2012-01-02 ENCOUNTER — Ambulatory Visit (AMBULATORY_SURGERY_CENTER): Payer: 59 | Admitting: Gastroenterology

## 2012-01-02 ENCOUNTER — Encounter: Payer: Self-pay | Admitting: Gastroenterology

## 2012-01-02 VITALS — BP 125/73 | HR 61 | Temp 98.6°F | Resp 17 | Ht 68.0 in | Wt 245.0 lb

## 2012-01-02 DIAGNOSIS — K5730 Diverticulosis of large intestine without perforation or abscess without bleeding: Secondary | ICD-10-CM

## 2012-01-02 DIAGNOSIS — D128 Benign neoplasm of rectum: Secondary | ICD-10-CM

## 2012-01-02 DIAGNOSIS — D129 Benign neoplasm of anus and anal canal: Secondary | ICD-10-CM

## 2012-01-02 DIAGNOSIS — K219 Gastro-esophageal reflux disease without esophagitis: Secondary | ICD-10-CM

## 2012-01-02 DIAGNOSIS — D126 Benign neoplasm of colon, unspecified: Secondary | ICD-10-CM

## 2012-01-02 DIAGNOSIS — Z800 Family history of malignant neoplasm of digestive organs: Secondary | ICD-10-CM

## 2012-01-02 MED ORDER — SODIUM CHLORIDE 0.9 % IV SOLN: 500.0000 mL | INTRAVENOUS | Status: DC

## 2012-01-02 NOTE — Progress Notes (Signed)
Patient did not experience any of the following events: a burn prior to discharge; a fall within the facility; wrong site/side/patient/procedure/implant event; or a hospital transfer or hospital admission upon discharge from the facility. (G8907) Patient did not have preoperative order for IV antibiotic SSI prophylaxis. (G8918)  

## 2012-01-02 NOTE — Op Note (Signed)
[facility] [address] [place], [place]  [address]  ENDOSCOPY PROCEDURE REPORT  PATIENT:  [name] [name], [name] [name]  MR#:  [id number] BIRTHDATE:  [birth year], 61 yrs. old  GENDER:  female  ENDOSCOPIST:  Vania Rea. Jarold Motto, MD, [facility] Referred by:  PROCEDURE DATE:  01/02/2012 PROCEDURE: ASA CLASS:  Class II INDICATIONS:  GERD r/o BARRETT'S MUCOSA  MEDICATIONS:   There was residual sedation effect present from prior procedure., propofol (Diprivan) 50 mg IV, glycopyrrolate (Robinal) 0.2 mg IV TOPICAL ANESTHETIC:  Cetacaine Spray  DESCRIPTION OF PROCEDURE:   After the risks and benefits of the procedure were explained, informed consent was obtained.  The [facility] GIF-H180 [id number] endoscope was introduced through the mouth and advanced to the second portion of the duodenum.  The instrument was slowly withdrawn as the mucosa was fully examined. <<PROCEDUREIMAGES>>  A hiatal hernia was found. 4 CM. HH NOTED.  The upper, middle, and distal third of the esophagus were carefully inspected and no abnormalities were noted. The z-line was well seen at the GEJ. The endoscope was pushed into the fundus which was normal including a retroflexed view. The antrum,gastric body, first and second part of the duodenum were unremarkable. NO BARRETT'S MUCOSA NOTED Retroflexed views revealed no abnormalities.    The scope was then withdrawn from the patient and the procedure completed.  COMPLICATIONS:  None  ENDOSCOPIC IMPRESSION: 1) Hiatal hernia 2) Normal EGD RECOMMENDATIONS: 1) continue PPI  ______________________________ Vania Rea. Jarold Motto, MD, [facility]  CC:  Herb Grays, MD  n. Rosalie DoctorVania Rea. Orissa Arreaga at 01/02/2012 02:50 PM  Hilarie Fredrickson, [phone]

## 2012-01-02 NOTE — Patient Instructions (Signed)
YOU HAD AN ENDOSCOPIC PROCEDURE TODAY AT THE [facility]: Refer to the procedure report that was given to you for any specific questions about what was found during the examination.  If the procedure report does not answer your questions, please call your gastroenterologist to clarify.  If you requested that your care partner not be given the details of your procedure findings, then the procedure report has been included in a sealed envelope for you to review at your convenience later.  YOU SHOULD EXPECT: Some feelings of bloating in the abdomen. Passage of more gas than usual.  Walking can help get rid of the air that was put into your GI tract during the procedure and reduce the bloating. If you had a lower endoscopy (such as a colonoscopy or flexible sigmoidoscopy) you may notice spotting of blood in your stool or on the toilet paper. If you underwent a bowel prep for your procedure, then you may not have a normal bowel movement for a few days.  DIET: Your first meal following the procedure should be a light meal and then it is ok to progress to your normal diet.  A half-sandwich or bowl of soup is an example of a good first meal.  Heavy or fried foods are harder to digest and may make you feel nauseous or bloated.  Likewise meals heavy in dairy and vegetables can cause extra gas to form and this can also increase the bloating.  Drink plenty of fluids but you should avoid alcoholic beverages for 24 hours.  ACTIVITY: Your care partner should take you home directly after the procedure.  You should plan to take it easy, moving slowly for the rest of the day.  You can resume normal activity the day after the procedure however you should NOT DRIVE or use heavy machinery for 24 hours (because of the sedation medicines used during the test).    SYMPTOMS TO REPORT IMMEDIATELY: A gastroenterologist can be reached at any hour.  During normal business hours, 8:30 AM to 5:00 PM Monday through Friday,  call [phone].  After hours and on weekends, please call the GI answering service at [phone] who will take a message and have the physician on call contact you.   Following lower endoscopy (colonoscopy or flexible sigmoidoscopy):  Excessive amounts of blood in the stool  Significant tenderness or worsening of abdominal pains  Swelling of the abdomen that is new, acute  Fever of 100F or higher  Following upper endoscopy (EGD)  Vomiting of blood or coffee ground material  New chest pain or pain under the shoulder blades  Painful or persistently difficult swallowing  New shortness of breath  Fever of 100F or higher  Black, tarry-looking stools  FOLLOW UP: If any biopsies were taken you will be contacted by phone or by letter within the next 1-3 weeks.  Call your gastroenterologist if you have not heard about the biopsies in 3 weeks.  Our staff will call the home number listed on your records the next business day following your procedure to check on you and address any questions or concerns that you may have at that time regarding the information given to you following your procedure. This is a courtesy call and so if there is no answer at the home number and we have not heard from you through the emergency physician on call, we will assume that you have returned to your regular daily activities without incident.  SIGNATURES/CONFIDENTIALITY: You and/or your care   partner have signed paperwork which will be entered into your electronic medical record.  These signatures attest to the fact that that the information above on your After Visit Summary has been reviewed and is understood.  Full responsibility of the confidentiality of this discharge information lies with you and/or your care-partner.  

## 2012-01-02 NOTE — Addendum Note (Signed)
Addended by: Lamar Blinks on: 01/02/2012 03:30 PM   Modules accepted: Orders

## 2012-01-02 NOTE — Op Note (Signed)
[facility] [address] [place], [place]  [address]  COLONOSCOPY PROCEDURE REPORT  PATIENT:  [name] [name], [name]  MR#:  [id number] BIRTHDATE:  1951/04/12, 61 yrs. old  GENDER:  female ENDOSCOPIST:  Vania Rea. Jarold Motto, MD, [facility] REF. BY: PROCEDURE DATE:  01/02/2012 PROCEDURE:  Colonoscopy with biopsy ASA CLASS:  Class II INDICATIONS:  family history of colon cancer MEDICATIONS:   propofol (Diprivan) 200 mg IV  DESCRIPTION OF PROCEDURE:   After the risks and benefits and of the procedure were explained, informed consent was obtained. Digital rectal exam was performed and revealed no abnormalities. The LB CF-[id number] endoscope was introduced through the anus and advanced to the cecum, which was identified by both the appendix and ileocecal valve.  The quality of the prep was excellent, using MoviPrep.  The instrument was then slowly withdrawn as the colon was fully examined. <<PROCEDUREIMAGES>>  FINDINGS:  There were mild diverticular changes in left colon. diverticulosis was found in the sigmoid to descending colon segments.  No polyps or cancers were seen.   Retroflexed views in the rectum revealed no abnormalities.  nodular recto-sigmoid area biopsied,,,,  The scope was then withdrawn from the patient and the procedure completed.  COMPLICATIONS:  None ENDOSCOPIC IMPRESSION: 1) Diverticulosis,mild,left sided diverticulosis in the sigmoid to descending colon segments 2) No polyps or cancers RECOMMENDATIONS: 1) Await biopsy results 2) Repeat colonoscopy in 5 years if polyp adenomatous; otherwise 10 years  REPEAT EXAM:  No  ______________________________ Vania Rea. Jarold Motto, MD, [facility]  CC:  Herb Grays, MD  n. Rosalie DoctorVania Rea. Patterson at 01/02/2012 02:44 PM  Hilarie Fredrickson, [phone]

## 2012-01-05 ENCOUNTER — Telehealth: Payer: Self-pay | Admitting: *Deleted

## 2012-01-05 NOTE — Telephone Encounter (Signed)
  Follow up Call-  Call back number 01/02/2012  Post procedure Call Back phone  # [phone]  Permission to leave phone message Yes     Patient questions:  Do you have a fever, pain , or abdominal swelling? no Pain Score  0 *  Have you tolerated food without any problems? yes  Have you been able to return to your normal activities? yes  Do you have any questions about your discharge instructions: Diet   no Medications  no Follow up visit  no  Do you have questions or concerns about your Care? no  Actions: * If pain score is 4 or above: No action needed, pain <4.

## 2012-01-08 ENCOUNTER — Encounter: Payer: Self-pay | Admitting: Gastroenterology

## 2012-01-13 ENCOUNTER — Encounter: Payer: Self-pay | Admitting: Gastroenterology

## 2012-01-16 ENCOUNTER — Ambulatory Visit (INDEPENDENT_AMBULATORY_CARE_PROVIDER_SITE_OTHER)
Admission: RE | Admit: 2012-01-16 | Discharge: 2012-01-16 | Disposition: A | Discharge status: Home / Self Care | Payer: 59 | Source: Ambulatory Visit | Attending: Internal Medicine | Admitting: Internal Medicine

## 2012-01-16 ENCOUNTER — Telehealth: Payer: Self-pay | Admitting: Internal Medicine

## 2012-01-16 DIAGNOSIS — S82899A Other fracture of unspecified lower leg, initial encounter for closed fracture: Secondary | ICD-10-CM

## 2012-01-16 DIAGNOSIS — M25571 Pain in right ankle and joints of right foot: Secondary | ICD-10-CM

## 2012-01-16 DIAGNOSIS — M25579 Pain in unspecified ankle and joints of unspecified foot: Secondary | ICD-10-CM

## 2012-01-16 DIAGNOSIS — M25572 Pain in left ankle and joints of left foot: Secondary | ICD-10-CM

## 2012-01-16 NOTE — Telephone Encounter (Signed)
Rechecked BP again and was 98/68

## 2012-01-16 NOTE — Telephone Encounter (Signed)
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

## 2012-01-16 NOTE — Telephone Encounter (Signed)
Rechecked BP 15 mins. Later per MD instructions, was 92/64

## 2012-01-16 NOTE — Telephone Encounter (Signed)
Pt walked into office today after stumble and fall on the [employer] property, felt faint, and c/o pain worst to the right ankle  Pt was laid down on exam table in exam room (was able to stand, walk 2-3 steps and climb on exam table from wheelchair), felt improved/less dizzy; brief exam c/w mild abrasion to right ant leg appr 6 cm below knee, but marked right ankle lateral swelling and possible contusion  with decreased ROM and pain on passive ROM, stated felt a "pop" when stepped into a "gully", right knee hurts mildly but no effusion and exhibits active FROM and NT  Some discomfort as well to the left ankle but no swelling or contusion noted  BP after 10 min lying down- 88/62 - often low with pain per pt, usual BP more like 112 systolic  Will ask pt to continue lying down for 15-30 min, repeat BP, then i fok for xray to bilat ankles; pt states will take alleve prn, and I think most likely able to continue at the workplace if pain and dizziness improved  Will forward also to Robin for any further documentation needed

## 2012-01-27 ENCOUNTER — Ambulatory Visit: Payer: 59 | Admitting: Endocrinology

## 2012-01-27 ENCOUNTER — Encounter: Payer: Self-pay | Admitting: Endocrinology

## 2012-01-27 ENCOUNTER — Ambulatory Visit (INDEPENDENT_AMBULATORY_CARE_PROVIDER_SITE_OTHER): Payer: 59 | Admitting: Endocrinology

## 2012-01-27 VITALS — BP 128/78 | HR 77 | Temp 98.2°F

## 2012-01-27 DIAGNOSIS — L02619 Cutaneous abscess of unspecified foot: Secondary | ICD-10-CM

## 2012-01-27 DIAGNOSIS — L03119 Cellulitis of unspecified part of limb: Secondary | ICD-10-CM

## 2012-01-27 MED ORDER — LEVOFLOXACIN 500 MG PO TABS
500.0000 mg | ORAL_TABLET | Freq: Every day | ORAL | Status: DC
Start: 2012-01-27 — End: 2012-02-03

## 2012-01-27 NOTE — Progress Notes (Signed)
Subjective:    Patient ID: [name] [name], female    DOB: 13-Jul-1950, 61 y.o.   MRN: [id number]  HPI On 8/9 13, pt tripped and fell, causing fx of the right lateral malleolus.  She now has 3 days of moderate redness of the right foot, and assoc pain.   Past Medical History  Diagnosis Date  . Hypothyroidism   . Osteoporosis   . Rosacea   . Granuloma annulare   . Psoriasis   . Dyslipidemia   . Multinodular goiter   . Fibromyalgia   . Anxiety   . Diverticulosis of colon (without mention of hemorrhage)   . Family history of malignant neoplasm of gastrointestinal tract     Past Surgical History  Procedure Date  . Tubal ligation   . Knee surgery     left  . Tonsillectomy     History   Social History  . Marital Status: Married    Spouse Name: N/A    Number of Children: 0  . 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 Smoking status: Never Smoker   . Smokeless tobacco: Never Used  . Alcohol Use: Yes     wine, beer, mixed drinks on weekends  . Drug Use: No  . Sexually Active: Not on file   Other Topics Concern  . Not on file   Social History Narrative  . No narrative on file    Current Outpatient Prescriptions on File Prior to Visit  Medication Sig Dispense Refill  . aspirin 81 MG tablet Take 81 mg by mouth daily.        . calcium-vitamin D (CALCIUM 500+D) 500-200 MG-UNIT per tablet Take 1 tablet by mouth daily.        . cetirizine (ZYRTEC) 10 MG tablet Take 10 mg by mouth as needed.      . clobetasol ointment (TEMOVATE) 0.05 % Apply topically as needed.      . cyclobenzaprine (FLEXERIL) 10 MG tablet Take 10 mg by mouth as needed.      . diclofenac sodium (VOLTAREN) 1 % GEL Apply 1 application topically as needed.      . folic acid (FOLVITE) 1 MG tablet Take 1 mg by mouth daily.        . furosemide (LASIX) 20 MG tablet Take 0.5-1 tablets (10-20 mg total) by mouth daily as needed.  30 tablet  0  .  levothyroxine (SYNTHROID, LEVOTHROID) 100 MCG tablet Take 100 mcg by mouth daily.      . Multiple Vitamin (MULTIVITAMIN) capsule Take 1 capsule by mouth daily.        Marland Kitchen omeprazole (PRILOSEC) 40 MG capsule Take 40 mg by mouth daily.      . ValACYclovir HCl (VALTREX PO) Take 1 g by mouth as needed. Take 1 gram daily      . Calcitriol (VECTICAL) 3 MCG/GM cream Apply topically at bedtime.        Allergies  Allergen Reactions  . Erythromycin   . Nabumetone     Family History  Problem Relation Age of Onset  . Colon cancer Father   . Diverticulitis Mother   . Prostate cancer Maternal Uncle   . Breast cancer      great aunt  . Diabetes Paternal Grandfather   . Heart disease Mother   . Colon cancer Paternal Grandmother     BP 128/78  Pulse 77  Temp 98.2 F (36.8  C) (Oral)  SpO2 97%  Review of Systems Denies fever    Objective:   Physical Exam VITAL SIGNS:  See vs page GENERAL: no distress RLE; ecchymoses of the toes and leg Right foot: 8x10 cm area of erythema, but no ulcer.     Assessment & Plan:  Cellulitis, new

## 2012-01-27 NOTE — Patient Instructions (Addendum)
i have sent a prescription to your pharmacy, for an antibiotic.   You will get better much faster if you elevate your foot above the rest of your body.  I hope you feel better soon.  If you don't feel better by next week, please call dr spear.  Please call sooner if you get worse.

## 2012-02-03 ENCOUNTER — Encounter: Payer: Self-pay | Admitting: Endocrinology

## 2012-02-03 ENCOUNTER — Ambulatory Visit (INDEPENDENT_AMBULATORY_CARE_PROVIDER_SITE_OTHER): Payer: 59 | Admitting: Endocrinology

## 2012-02-03 VITALS — BP 132/86 | HR 73 | Temp 97.8°F | Ht 69.0 in | Wt 243.0 lb

## 2012-02-03 DIAGNOSIS — L03119 Cellulitis of unspecified part of limb: Secondary | ICD-10-CM

## 2012-02-03 DIAGNOSIS — L02619 Cutaneous abscess of unspecified foot: Secondary | ICD-10-CM

## 2012-02-03 NOTE — Patient Instructions (Addendum)
Refer to an orthopedic specialist.  you will receive a phone call, about a day and time for an appointment 

## 2012-02-03 NOTE — Progress Notes (Signed)
Subjective:    Patient ID: [name] [name], female    DOB: 08-13-1950, 61 y.o.   MRN: [id number]  HPI Pt says right foot pain is more than 50% better.  This started with an injury.  There is associated itching. Past Medical History  Diagnosis Date  . Hypothyroidism   . Osteoporosis   . Rosacea   . Granuloma annulare   . Psoriasis   . Dyslipidemia   . Multinodular goiter   . Fibromyalgia   . Anxiety   . Diverticulosis of colon (without mention of hemorrhage)   . Family history of malignant neoplasm of gastrointestinal tract     Past Surgical History  Procedure Date  . Tubal ligation   . Knee surgery     left  . Tonsillectomy     History   Social History  . Marital Status: Married    Spouse Name: N/A    Number of Children: 0  . 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 Smoking status: Never Smoker   . Smokeless tobacco: Never Used  . Alcohol Use: Yes     wine, beer, mixed drinks on weekends  . Drug Use: No  . Sexually Active: Not on file   Other Topics Concern  . Not on file   Social History Narrative  . No narrative on file    Current Outpatient Prescriptions on File Prior to Visit  Medication Sig Dispense Refill  . aspirin 81 MG tablet Take 81 mg by mouth daily.        . Calcitriol (VECTICAL) 3 MCG/GM cream Apply topically at bedtime.      . calcium-vitamin D (CALCIUM 500+D) 500-200 MG-UNIT per tablet Take 1 tablet by mouth daily.        . cetirizine (ZYRTEC) 10 MG tablet Take 10 mg by mouth as needed.      . clobetasol ointment (TEMOVATE) 0.05 % Apply topically as needed.      . cyclobenzaprine (FLEXERIL) 10 MG tablet Take 10 mg by mouth as needed.      . diclofenac sodium (VOLTAREN) 1 % GEL Apply 1 application topically as needed.      . folic acid (FOLVITE) 1 MG tablet Take 1 mg by mouth daily.        . furosemide (LASIX) 20 MG tablet Take 0.5-1 tablets (10-20 mg total) by mouth daily as  needed.  30 tablet  0  . levothyroxine (SYNTHROID, LEVOTHROID) 100 MCG tablet Take 100 mcg by mouth daily.      . Multiple Vitamin (MULTIVITAMIN) capsule Take 1 capsule by mouth daily.        Marland Kitchen omeprazole (PRILOSEC) 40 MG capsule Take 40 mg by mouth daily.      . ValACYclovir HCl (VALTREX PO) Take 1 g by mouth as needed. Take 1 gram daily        Allergies  Allergen Reactions  . Erythromycin   . Nabumetone     Family History  Problem Relation Age of Onset  . Colon cancer Father   . Diverticulitis Mother   . Prostate cancer Maternal Uncle   . Breast cancer      great aunt  . Diabetes Paternal Grandfather   . Heart disease Mother   . Colon cancer Paternal Grandmother     BP 132/86  Pulse 73  Temp 97.8 F (36.6 C) (Oral)  Ht 5\' 9"  (1.753 m)  Wt 243  lb (110.224 kg)  BMI 35.88 kg/m2  SpO2 96%  Review of Systems Denies fever.    Objective:   Physical Exam VITAL SIGNS:  See vs page GENERAL: no distress Right foot: moderate erythema at the instep, but no tenderness/swelling/warmth.      Assessment & Plan:  Cellulitis of foot--slow improvement

## 2012-02-20 ENCOUNTER — Encounter (HOSPITAL_BASED_OUTPATIENT_CLINIC_OR_DEPARTMENT_OTHER): Payer: Self-pay | Admitting: *Deleted

## 2012-02-20 NOTE — Progress Notes (Signed)
Pt had an echo and stress test with  2005-cannot find in epic or echart-only referenced in ov 08. No tx

## 2012-02-20 NOTE — Progress Notes (Signed)
Pt takes lasix only on long trips for swelling legs-has not had in months-no labs needed Hurt her rt foot a few weeks ago-brace on [facility] [employer] primary care Saw cardiology 5-6 yr ago  for echo-was ok will try to get

## 2012-02-25 ENCOUNTER — Other Ambulatory Visit: Payer: Self-pay | Admitting: Orthopedic Surgery

## 2012-02-27 ENCOUNTER — Encounter (HOSPITAL_BASED_OUTPATIENT_CLINIC_OR_DEPARTMENT_OTHER)
Admission: RE | Disposition: A | Discharge status: Home / Self Care | Payer: Self-pay | Source: Ambulatory Visit | Attending: Orthopedic Surgery

## 2012-02-27 ENCOUNTER — Encounter (HOSPITAL_BASED_OUTPATIENT_CLINIC_OR_DEPARTMENT_OTHER): Payer: Self-pay | Admitting: *Deleted

## 2012-02-27 ENCOUNTER — Ambulatory Visit (HOSPITAL_BASED_OUTPATIENT_CLINIC_OR_DEPARTMENT_OTHER): Payer: 59 | Admitting: *Deleted

## 2012-02-27 ENCOUNTER — Encounter (HOSPITAL_BASED_OUTPATIENT_CLINIC_OR_DEPARTMENT_OTHER): Payer: Self-pay

## 2012-02-27 ENCOUNTER — Ambulatory Visit (HOSPITAL_BASED_OUTPATIENT_CLINIC_OR_DEPARTMENT_OTHER)
Admission: RE | Admit: 2012-02-27 | Discharge: 2012-02-27 | Disposition: A | Discharge status: Home / Self Care | Payer: 59 | Source: Ambulatory Visit | Attending: Orthopedic Surgery | Admitting: Orthopedic Surgery

## 2012-02-27 DIAGNOSIS — M7989 Other specified soft tissue disorders: Secondary | ICD-10-CM | POA: Diagnosis present

## 2012-02-27 DIAGNOSIS — IMO0001 Reserved for inherently not codable concepts without codable children: Secondary | ICD-10-CM | POA: Insufficient documentation

## 2012-02-27 DIAGNOSIS — E039 Hypothyroidism, unspecified: Secondary | ICD-10-CM | POA: Insufficient documentation

## 2012-02-27 DIAGNOSIS — K219 Gastro-esophageal reflux disease without esophagitis: Secondary | ICD-10-CM | POA: Insufficient documentation

## 2012-02-27 DIAGNOSIS — D1739 Benign lipomatous neoplasm of skin and subcutaneous tissue of other sites: Secondary | ICD-10-CM | POA: Insufficient documentation

## 2012-02-27 HISTORY — DX: Gastro-esophageal reflux disease without esophagitis: K21.9

## 2012-02-27 HISTORY — PX: MASS EXCISION: SHX2000

## 2012-02-27 HISTORY — DX: Snoring: R06.83

## 2012-02-27 LAB — POCT HEMOGLOBIN-HEMACUE: Hemoglobin: 14.4 g/dL (ref 12.0–15.0)

## 2012-02-27 SURGERY — EXCISION MASS
Anesthesia: General | Site: Arm Lower | Laterality: Left | Wound class: Clean

## 2012-02-27 MED ORDER — BUPIVACAINE HCL (PF) 0.5 % IJ SOLN
INTRAMUSCULAR | Status: DC | PRN
  Administered 2012-02-27: 10 mL

## 2012-02-27 MED ORDER — SCOPOLAMINE 1 MG/3DAYS TD PT72
MEDICATED_PATCH | TRANSDERMAL | Status: DC | PRN
  Administered 2012-02-27: 1 via TRANSDERMAL

## 2012-02-27 MED ORDER — HYDROMORPHONE HCL PF 1 MG/ML IJ SOLN
0.2500 mg | INTRAMUSCULAR | Status: DC | PRN
  Administered 2012-02-27 (×4): 0.5 mg via INTRAVENOUS

## 2012-02-27 MED ORDER — DEXAMETHASONE SODIUM PHOSPHATE 10 MG/ML IJ SOLN
INTRAMUSCULAR | Status: DC | PRN
  Administered 2012-02-27: 10 mg via INTRAVENOUS

## 2012-02-27 MED ORDER — LIDOCAINE HCL (CARDIAC) 20 MG/ML IV SOLN
INTRAVENOUS | Status: DC | PRN
  Administered 2012-02-27: 60 mg via INTRAVENOUS

## 2012-02-27 MED ORDER — OXYCODONE-ACETAMINOPHEN 5-325 MG PO TABS: 1.0000 | ORAL_TABLET | ORAL | Status: DC | PRN

## 2012-02-27 MED ORDER — CEFAZOLIN SODIUM 10 G IJ SOLR
3.0000 g | INTRAMUSCULAR | Status: DC | PRN
  Administered 2012-02-27: 3 g via INTRAVENOUS

## 2012-02-27 MED ORDER — DEXTROSE 5 % IV SOLN: 3.0000 g | INTRAVENOUS | Status: DC | PRN

## 2012-02-27 MED ORDER — MIDAZOLAM HCL 5 MG/5ML IJ SOLN
INTRAMUSCULAR | Status: DC | PRN
  Administered 2012-02-27: 1 mg via INTRAVENOUS

## 2012-02-27 MED ORDER — LACTATED RINGERS IV SOLN
INTRAVENOUS | Status: DC
  Administered 2012-02-27 (×2): via INTRAVENOUS

## 2012-02-27 MED ORDER — CHLORHEXIDINE GLUCONATE 4 % EX LIQD: 60.0000 mL | Freq: Once | CUTANEOUS | Status: DC

## 2012-02-27 MED ORDER — OXYCODONE HCL 5 MG PO TABS: 5.0000 mg | ORAL_TABLET | Freq: Once | ORAL | Status: DC | PRN

## 2012-02-27 MED ORDER — MEPERIDINE HCL 25 MG/ML IJ SOLN: 6.2500 mg | INTRAMUSCULAR | Status: DC | PRN

## 2012-02-27 MED ORDER — OXYCODONE HCL 5 MG/5ML PO SOLN: 5.0000 mg | Freq: Once | ORAL | Status: DC | PRN

## 2012-02-27 MED ORDER — FENTANYL CITRATE 0.05 MG/ML IJ SOLN
INTRAMUSCULAR | Status: DC | PRN
  Administered 2012-02-27 (×2): 50 ug via INTRAVENOUS
  Administered 2012-02-27: 25 ug via INTRAVENOUS

## 2012-02-27 MED ORDER — ONDANSETRON HCL 4 MG/2ML IJ SOLN
INTRAMUSCULAR | Status: DC | PRN
  Administered 2012-02-27: 4 mg via INTRAVENOUS

## 2012-02-27 MED ORDER — PROPOFOL 10 MG/ML IV BOLUS
INTRAVENOUS | Status: DC | PRN
  Administered 2012-02-27: 200 mg via INTRAVENOUS

## 2012-02-27 MED ORDER — PROMETHAZINE HCL 25 MG/ML IJ SOLN: 6.2500 mg | INTRAMUSCULAR | Status: DC | PRN

## 2012-02-27 SURGICAL SUPPLY — 46 items
APL SKNCLS STERI-STRIP NONHPOA (GAUZE/BANDAGES/DRESSINGS) ×1
BAG DECANTER FOR FLEXI CONT (MISCELLANEOUS) IMPLANT
BANDAGE ELASTIC 3 VELCRO ST LF (GAUZE/BANDAGES/DRESSINGS) ×4 IMPLANT
BANDAGE ELASTIC 4 VELCRO ST LF (GAUZE/BANDAGES/DRESSINGS) IMPLANT
BANDAGE GAUZE ELAST BULKY 4 IN (GAUZE/BANDAGES/DRESSINGS) ×2 IMPLANT
BENZOIN TINCTURE PRP APPL 2/3 (GAUZE/BANDAGES/DRESSINGS) ×2 IMPLANT
BLADE SURG 15 STRL LF DISP TIS (BLADE) ×1 IMPLANT
BLADE SURG 15 STRL SS (BLADE) ×2
BNDG CMPR 9X4 STRL LF SNTH (GAUZE/BANDAGES/DRESSINGS) ×1
BNDG ESMARK 4X9 LF (GAUZE/BANDAGES/DRESSINGS) ×2 IMPLANT
CLOTH BEACON ORANGE TIMEOUT ST (SAFETY) ×2 IMPLANT
CORDS BIPOLAR (ELECTRODE) ×2 IMPLANT
COVER TABLE BACK 60X90 (DRAPES) ×2 IMPLANT
CUFF TOURNIQUET SINGLE 18IN (TOURNIQUET CUFF) IMPLANT
CUFF TOURNIQUET SINGLE 24IN (TOURNIQUET CUFF) ×2 IMPLANT
DECANTER SPIKE VIAL GLASS SM (MISCELLANEOUS) IMPLANT
DRAPE EXTREMITY T 121X128X90 (DRAPE) ×2 IMPLANT
DRAPE SURG 17X23 STRL (DRAPES) ×2 IMPLANT
DURAPREP 26ML APPLICATOR (WOUND CARE) ×2 IMPLANT
GAUZE XEROFORM 1X8 LF (GAUZE/BANDAGES/DRESSINGS) IMPLANT
GLOVE BIO SURGEON STRL SZ8.5 (GLOVE) ×2 IMPLANT
GLOVE BIOGEL M STRL SZ7.5 (GLOVE) ×2 IMPLANT
GOWN PREVENTION PLUS XLARGE (GOWN DISPOSABLE) ×2 IMPLANT
GOWN PREVENTION PLUS XXLARGE (GOWN DISPOSABLE) ×2 IMPLANT
NEEDLE HYPO 25X1 1.5 SAFETY (NEEDLE) ×2 IMPLANT
NS IRRIG 1000ML POUR BTL (IV SOLUTION) ×2 IMPLANT
PACK BASIN DAY SURGERY FS (CUSTOM PROCEDURE TRAY) ×2 IMPLANT
PAD CAST 3X4 CTTN HI CHSV (CAST SUPPLIES) ×2 IMPLANT
PADDING CAST COTTON 3X4 STRL (CAST SUPPLIES) ×4
SHEET MEDIUM DRAPE 40X70 STRL (DRAPES) ×2 IMPLANT
SPLINT PLASTER CAST XFAST 4X15 (CAST SUPPLIES) ×15 IMPLANT
SPLINT PLASTER XTRA FAST SET 4 (CAST SUPPLIES) ×15
SPONGE GAUZE 4X4 12PLY (GAUZE/BANDAGES/DRESSINGS) ×2 IMPLANT
STOCKINETTE 4X48 STRL (DRAPES) ×2 IMPLANT
STRIP CLOSURE SKIN 1/2X4 (GAUZE/BANDAGES/DRESSINGS) ×2 IMPLANT
SUT ETHILON 5 0 PS 2 18 (SUTURE) IMPLANT
SUT PROLENE 3 0 PS 2 (SUTURE) IMPLANT
SUT VIC AB 4-0 P-3 18XBRD (SUTURE) IMPLANT
SUT VIC AB 4-0 P3 18 (SUTURE)
SUT VICRYL RAPIDE 4/0 PS 2 (SUTURE) ×2 IMPLANT
SYR BULB 3OZ (MISCELLANEOUS) ×2 IMPLANT
SYR CONTROL 10ML LL (SYRINGE) ×2 IMPLANT
SYRINGE 10CC LL (SYRINGE) IMPLANT
TOWEL OR 17X24 6PK STRL BLUE (TOWEL DISPOSABLE) ×2 IMPLANT
UNDERPAD 30X30 INCONTINENT (UNDERPADS AND DIAPERS) ×2 IMPLANT
WATER STERILE IRR 1000ML POUR (IV SOLUTION) ×2 IMPLANT

## 2012-02-27 NOTE — Anesthesia Postprocedure Evaluation (Signed)
  Anesthesia Post-op Note  Patient: [name] [name]  Procedure(s) Performed: Procedure(s) (LRB) with comments: EXCISION MASS (Left) - excision of left forearm/wrist mass  Patient Location: PACU  Anesthesia Type: General  Level of Consciousness: awake  Airway and Oxygen Therapy: Patient Spontanous Breathing  Post-op Pain: mild  Post-op Assessment: Post-op Vital signs reviewed  Post-op Vital Signs: stable  Complications: No apparent anesthesia complications

## 2012-02-27 NOTE — Anesthesia Procedure Notes (Signed)
Procedure Name: LMA Insertion Date/Time: 02/27/2012 12:28 PM Performed by: Virdia Ziesmer D Pre-anesthesia Checklist: Patient identified, Emergency Drugs available, Suction available and Patient being monitored Patient Re-evaluated:Patient Re-evaluated prior to inductionOxygen Delivery Method: Circle System Utilized Preoxygenation: Pre-oxygenation with 100% oxygen Intubation Type: IV induction Ventilation: Mask ventilation without difficulty LMA: LMA inserted LMA Size: 4.0 Number of attempts: 1 Airway Equipment and Method: bite block Placement Confirmation: positive ETCO2 Tube secured with: Tape Dental Injury: Teeth and Oropharynx as per pre-operative assessment

## 2012-02-27 NOTE — H&P (Signed)
[name] [name] is an 61 y.o. female.   Chief Complaint: left wrist volar ulnar mass HPI: as above with enlarging mass  Past Medical History  Diagnosis Date  . Hypothyroidism   . Osteoporosis   . Rosacea   . Granuloma annulare   . Psoriasis   . Dyslipidemia   . Multinodular goiter   . Fibromyalgia   . Anxiety   . Diverticulosis of colon (without mention of hemorrhage)   . Family history of malignant neoplasm of gastrointestinal tract   . Snores   . GERD (gastroesophageal reflux disease)     Past Surgical History  Procedure Date  . Tubal ligation   . Knee surgery     left  . Tonsillectomy   . Upper gastrointestinal endoscopy 2013    esoph dilation  . Colonoscopy 2013    Family History  Problem Relation Age of Onset  . Colon cancer Father   . Diverticulitis Mother   . Prostate cancer Maternal Uncle   . Breast cancer      great aunt  . Diabetes Paternal Grandfather   . Heart disease Mother   . Colon cancer Paternal Grandmother    Social History:  reports that she has never smoked. She has never used smokeless tobacco. She reports that she drinks alcohol. She reports that she does not use illicit drugs.  Allergies:  Allergies  Allergen Reactions  . Erythromycin   . Nabumetone     Medications Prior to Admission  Medication Sig Dispense Refill  . aspirin 81 MG tablet Take 81 mg by mouth daily.        . Calcitriol (VECTICAL) 3 MCG/GM cream Apply topically at bedtime.      . calcium-vitamin D (CALCIUM 500+D) 500-200 MG-UNIT per tablet Take 1 tablet by mouth daily.        . cetirizine (ZYRTEC) 10 MG tablet Take 10 mg by mouth as needed.      . cyclobenzaprine (FLEXERIL) 10 MG tablet Take 10 mg by mouth as needed.      . diclofenac sodium (VOLTAREN) 1 % GEL Apply 1 application topically as needed.      . folic acid (FOLVITE) 1 MG tablet Take 1 mg by mouth daily.        . furosemide (LASIX) 20 MG tablet Take 0.5-1 tablets (10-20 mg total) by mouth daily as needed.  30  tablet  0  . Multiple Vitamin (MULTIVITAMIN) capsule Take 1 capsule by mouth daily.        Marland Kitchen omeprazole (PRILOSEC) 40 MG capsule Take 40 mg by mouth daily.      . ValACYclovir HCl (VALTREX PO) Take 1 g by mouth as needed. Take 1 gram daily      . clobetasol ointment (TEMOVATE) 0.05 % Apply topically as needed.      Marland Kitchen levothyroxine (SYNTHROID, LEVOTHROID) 100 MCG tablet Take 100 mcg by mouth daily.        Results for orders placed during the hospital encounter of 02/27/12 (from the past 48 hour(s))  POCT HEMOGLOBIN-HEMACUE     Status: Normal   Collection Time   02/27/12 11:33 AM      Component Value Range Comment   Hemoglobin 14.4  12.0 - 15.0 g/dL    No results found.  Review of Systems  All other systems reviewed and are negative.    Blood pressure 133/84, pulse 68, temperature 98.4 F (36.9 C), temperature source Oral, resp. rate 20, height 5\' 9"  (1.753 m), weight 110.496  kg (243 lb 9.6 oz), SpO2 96.00%. Physical Exam  Constitutional: She is oriented to person, place, and time. She appears well-developed and well-nourished.  HENT:  Head: Normocephalic and atraumatic.  Cardiovascular: Normal rate.   Respiratory: Effort normal.  Musculoskeletal:       Left wrist: She exhibits swelling.       Arms: Neurological: She is alert and oriented to person, place, and time.  Skin: Skin is warm.  Psychiatric: She has a normal mood and affect. Her behavior is normal. Judgment and thought content normal.     Assessment/Plan As above  Plan excisional biopsy  Danity Schmelzer A 02/27/2012, 12:08 PM

## 2012-02-27 NOTE — Anesthesia Preprocedure Evaluation (Addendum)
Anesthesia Evaluation  Patient identified by MRN, date of birth, ID band Patient awake    Reviewed: Allergy & Precautions, H&P , NPO status , Patient's Chart, lab work & pertinent test results  History of Anesthesia Complications Negative for: history of anesthetic complications  Airway Mallampati: II  Neck ROM: Full    Dental  (+) Teeth Intact and Caps,    Pulmonary neg pulmonary ROS,  breath sounds clear to auscultation        Cardiovascular negative cardio ROS  Rhythm:Regular Rate:Normal     Neuro/Psych  Neuromuscular disease    GI/Hepatic GERD-  ,Hx fatty liver   Endo/Other  Hypothyroidism   Renal/GU      Musculoskeletal  (+) Fibromyalgia -  Abdominal (+) + obese,   Peds  Hematology   Anesthesia Other Findings   Reproductive/Obstetrics                          Anesthesia Physical Anesthesia Plan  ASA: II  Anesthesia Plan: General   Post-op Pain Management:    Induction: Intravenous  Airway Management Planned: LMA  Additional Equipment:   Intra-op Plan:   Post-operative Plan: Extubation in OR  Informed Consent: I have reviewed the patients History and Physical, chart, labs and discussed the procedure including the risks, benefits and alternatives for the proposed anesthesia with the patient or authorized representative who has indicated his/her understanding and acceptance.   Dental advisory given  Plan Discussed with: CRNA and Surgeon  Anesthesia Plan Comments:         Anesthesia Quick Evaluation

## 2012-02-27 NOTE — Transfer of Care (Signed)
Immediate Anesthesia Transfer of Care Note  Patient: [name] [name]  Procedure(s) Performed: Procedure(s) (LRB) with comments: EXCISION MASS (Left) - excision of left forearm/wrist mass  Patient Location: PACU  Anesthesia Type: General  Level of Consciousness: awake, alert , oriented and patient cooperative  Airway & Oxygen Therapy: Patient Spontanous Breathing and Patient connected to face mask oxygen  Post-op Assessment: Report given to PACU RN and Post -op Vital signs reviewed and stable  Post vital signs: Reviewed and stable  Complications: No apparent anesthesia complications

## 2012-02-27 NOTE — Op Note (Signed)
See dictated note [phone]

## 2012-02-27 NOTE — Brief Op Note (Signed)
02/27/2012  1:05 PM  PATIENT:  [name] [name]  61 y.o. female  PRE-OPERATIVE DIAGNOSIS:  left forearm / wrist mass  POST-OPERATIVE DIAGNOSIS:  left forearm / wrist mass  PROCEDURE:  Procedure(s) (LRB) with comments: EXCISION MASS (Left) - excision of left forearm/wrist mass  SURGEON:  Surgeon(s) and Role:    * Marlowe Shores, MD - Primary  PHYSICIAN ASSISTANT:   ASSISTANTS: none   ANESTHESIA:   general  EBL:  Total I/O In: 1500 [I.V.:1500] Out: -   BLOOD ADMINISTERED:none  DRAINS: none   LOCAL MEDICATIONS USED:  MARCAINE   6cc  SPECIMEN:  Biopsy / Limited Resection  DISPOSITION OF SPECIMEN:  PATHOLOGY  COUNTS:  YES  TOURNIQUET:   Total Tourniquet Time Documented: Upper Arm (Left) - 26 minutes  DICTATION: .Other Dictation: Dictation Number [id number]  PLAN OF CARE: Discharge to home after PACU  PATIENT DISPOSITION:  PACU - hemodynamically stable.   Delay start of Pharmacological VTE agent (>24hrs) due to surgical blood loss or risk of bleeding: not applicable

## 2012-03-01 NOTE — Op Note (Signed)
NAMEREEYA, BOUND                ACCOUNT NO.:  [id number]  MEDICAL RECORD NO.:  [id number]  LOCATION:                                 FACILITY:  PHYSICIAN:  Artist Pais. Laycee Fitzsimmons, M.D.DATE OF BIRTH:  August 24, 1950  DATE OF PROCEDURE:  02/27/2012 DATE OF DISCHARGE:                              OPERATIVE REPORT   PREOPERATIVE DIAGNOSIS:  Enlarging mass above ulnar border of left distal forearm and wrist area.  POSTOPERATIVE DIAGNOSIS:  Enlarging mass above ulnar border of left distal forearm and wrist area.  PROCEDURE:  Excisional biopsy of deep mass.  SURGEON:  Artist Pais. Mina Marble, M.D.  ASSISTANT:  None.  ANESTHESIA:  General.  TOURNIQUET TIME:  26 minutes.  No complications.  No drains.  One specimen sent.  The patient was taken to the operating suite.  After induction of adequate general anesthesia, left upper extremity was prepped and draped in sterile fashion.  An Esmarch was used to exsanguinate the limb. Tourniquet was then inflated to 250 mmHg.  At this point in time, a longitudinal incision was made over the FCU tendon 5-6 cm.  Dissection was carried down to the skin and subcutaneous tissues.  The fascia overlying the ulnar side of the wrist volarly was incised.  The ulnar neurovascular bundle was identified.  There was a large swelling around the flexor tendons.  This was carefully dissected down to the level of the pronator quadratus.  There was a large intramuscular lipoma coming from inside the pronator quadratus muscle.  The median nerve was retracted to the midline and the ulnar neurovascular bone to the lateral side.  Dissection was carried down to the shaft of the ulna with this large lipoma which measured 5 x 5 cm, was removed in its entirety. There were no significant signs of blood vessel damage.  The wound was irrigated.  Hemostasis achieved with bipolar cautery and was loosely closed with a 4-0 Vicryl Rapide subcuticular stitch.  Steri-Strips, 4 x  4s, fluffs, and a volar splint was applied. The patient tolerated the procedure well and went to the recovery room in a stable fashion.     Artist Pais Mina Marble, M.D.     MAW/MEDQ  D:  02/27/2012  T:  02/28/2012  Job:  [id number]

## 2012-03-02 ENCOUNTER — Encounter (HOSPITAL_BASED_OUTPATIENT_CLINIC_OR_DEPARTMENT_OTHER): Payer: Self-pay | Admitting: Orthopedic Surgery

## 2012-03-02 NOTE — Addendum Note (Signed)
Addendum  created 03/02/12 1235 by Lance Coon, CRNA   Modules edited:Anesthesia Responsible Staff

## 2012-03-02 NOTE — Addendum Note (Signed)
Addendum  created 03/02/12 1234 by Lance Coon, CRNA   Modules edited:Anesthesia Responsible Staff

## 2012-05-03 ENCOUNTER — Ambulatory Visit (INDEPENDENT_AMBULATORY_CARE_PROVIDER_SITE_OTHER): Payer: 59 | Admitting: Cardiovascular Disease

## 2012-05-03 ENCOUNTER — Encounter: Payer: Self-pay | Admitting: Cardiovascular Disease

## 2012-05-03 VITALS — BP 122/84 | HR 73 | Ht 68.0 in | Wt 245.0 lb

## 2012-05-03 DIAGNOSIS — R0789 Other chest pain: Secondary | ICD-10-CM

## 2012-05-03 NOTE — Progress Notes (Signed)
Ceasar Lund Date of Birth  July 20, 1950       [facility]    [place] [address]  [address], [address] [place], [place]  [address]   [place], [place]  [address] [phone]     [phone]   Fax  [phone]    Fax [phone]  Problem List: 1. Chest pain 2. Hypothyroidism 3. Hyperlipidemia - had LFT elevations when on Atrovastatin and crestor.  History of Present Illness:  [name] [name] is a 61 yo who we are asked to see for further evaluation of some chest pain. She describes a pinprick-like sensation in her chest. It is centered in her chest.  there is no radiation.  It is not associated with diaphoresis dizziness nausea or vomiting.   It lasts only for a second.   It does not seem to be associated with exercise.   She broke her ankle in August and has not been able to exercise. She stretches on occasion.  She does not have any discomfort while bringing groceries.   She seems to have more of these episodes when she is anxious.    Current Outpatient Prescriptions on File Prior to Visit  Medication Sig Dispense Refill  . aspirin 81 MG tablet Take 81 mg by mouth daily.        . Calcitriol (VECTICAL) 3 MCG/GM cream Apply topically at bedtime.      . calcium-vitamin D (CALCIUM 500+D) 500-200 MG-UNIT per tablet Take 1 tablet by mouth daily.        . cetirizine (ZYRTEC) 10 MG tablet Take 10 mg by mouth as needed.      . clobetasol ointment (TEMOVATE) 0.05 % Apply topically as needed.      . cyclobenzaprine (FLEXERIL) 10 MG tablet Take 10 mg by mouth as needed.      . diclofenac sodium (VOLTAREN) 1 % GEL Apply 1 application topically as needed.      . folic acid (FOLVITE) 1 MG tablet Take 1 mg by mouth daily.        . furosemide (LASIX) 20 MG tablet Take 0.5-1 tablets (10-20 mg total) by mouth daily as needed.  30 tablet  0  . levothyroxine (SYNTHROID, LEVOTHROID) 100 MCG tablet Take 100 mcg by mouth daily.      . Multiple Vitamin (MULTIVITAMIN)  capsule Take 1 capsule by mouth daily.        Marland Kitchen omeprazole (PRILOSEC) 40 MG capsule Take 40 mg by mouth daily.      . ValACYclovir HCl (VALTREX PO) Take 1 g by mouth as needed. Take 1 gram daily        Allergies  Allergen Reactions  . Erythromycin   . Nabumetone     Past Medical History  Diagnosis Date  . Hypothyroidism   . Osteoporosis   . Rosacea   . Granuloma annulare   . Psoriasis   . Dyslipidemia   . Multinodular goiter   . Fibromyalgia   . Anxiety   . Diverticulosis of colon (without mention of hemorrhage)   . Family history of malignant neoplasm of gastrointestinal tract   . Snores   . GERD (gastroesophageal reflux disease)     Past Surgical History  Procedure Date  . Tubal ligation   . Knee surgery     left  . Tonsillectomy   . Upper gastrointestinal endoscopy 2013    esoph dilation  . Colonoscopy 2013  . Mass excision 02/27/2012    Procedure:  EXCISION MASS;  Surgeon: Marlowe Shores, MD;  Location: [facility];  Service: Orthopedics;  Laterality: Left;  excision of left forearm/wrist mass    History  Smoking status  . Never Smoker   Smokeless tobacco  . Never Used    History  Alcohol Use  . Yes    Comment: wine, beer, mixed drinks on weekends    Family History  Problem Relation Age of Onset  . Colon cancer Father   . Diverticulitis Mother   . Prostate cancer Maternal Uncle   . Breast cancer      great aunt  . Diabetes Paternal Grandfather   . Heart disease Mother   . Colon cancer Paternal Grandmother     Reviw of Systems:  Reviewed in the HPI.  All other systems are negative.  Physical Exam: Blood pressure 122/84, pulse 73, height 5\' 8"  (1.727 m), weight 245 lb (111.131 kg), SpO2 98.00%. General: Well developed, well nourished, in no acute distress.  Head: Normocephalic, atraumatic, sclera non-icteric, mucus membranes are moist,   Neck: Supple. Carotids are 2 + without bruits. No JVD   Lungs: Clear  Heart: RR, S1,  S2, very soft systolic murmur  Abdomen: Soft, non-tender, non-distended with normal bowel sounds.  Msk:  Strength and tone are normal  Extremities: No clubbing or cyanosis. No edema.  Distal pedal pulses are 2+ and equal    Neuro: CN II - XII intact.  Alert and oriented X 3.   Psych:  normal  ECG: Nov. 7, 2013 : [employer] RBBB . Otherwise normal.  Assessment / Plan:

## 2012-05-03 NOTE — Assessment & Plan Note (Signed)
[name] [name] presents with atypical chest pain. These episodes last for only a split second. They're not associated with exercise. They seem to be more associated with stress. Her EKG is essentially normal. She does have an incomplete right bundle branch block.  At this point I have reassured her that her chest pain is atypical. I do not think that she needs a stress test. I've asked her to exercise on regular basis. She's not able to increase her exercise addendum I have asked her to call me again.  She has a soft heart murmur that has been evaluated in the past. I do not have the notes from that which was found to have mild MR or TR.  I've reassured her that this appears to be benign. I do not think that we need an echocardiogram given her lack of symptoms. I have asked her to call me if she has any worsening dyspnea.

## 2012-05-03 NOTE — Patient Instructions (Addendum)
Your physician recommends that you schedule a follow-up appointment in: as needed basis, call if you have further questions or concerns.

## 2012-09-07 ENCOUNTER — Ambulatory Visit (INDEPENDENT_AMBULATORY_CARE_PROVIDER_SITE_OTHER): Payer: 59 | Admitting: Gastroenterology

## 2012-09-07 ENCOUNTER — Encounter: Payer: Self-pay | Admitting: Gastroenterology

## 2012-09-07 VITALS — BP 124/74 | HR 84 | Ht 68.0 in | Wt 252.6 lb

## 2012-09-07 DIAGNOSIS — L409 Psoriasis, unspecified: Secondary | ICD-10-CM

## 2012-09-07 DIAGNOSIS — K219 Gastro-esophageal reflux disease without esophagitis: Secondary | ICD-10-CM

## 2012-09-07 DIAGNOSIS — L408 Other psoriasis: Secondary | ICD-10-CM

## 2012-09-07 DIAGNOSIS — K7689 Other specified diseases of liver: Secondary | ICD-10-CM

## 2012-09-07 DIAGNOSIS — K760 Fatty (change of) liver, not elsewhere classified: Secondary | ICD-10-CM

## 2012-09-07 DIAGNOSIS — R9389 Abnormal findings on diagnostic imaging of other specified body structures: Secondary | ICD-10-CM

## 2012-09-07 NOTE — Patient Instructions (Addendum)
Your physician has requested that you go to the basement for lab work before leaving today.  You have been scheduled for an MRI at [facility] on . Your appointment time is 09-10-2012 at 10 am. Please arrive 15 minutes prior to your appointment time for registration purposes. There is no prep for this test. However, if you have any metal in your body, have a pacemaker or defibrillator, please be sure to let your ordering physician know. This test typically takes 45 minutes to 1 hour to complete.  Addendum to note above. Pt no longer sees Dr Birdie Sons; she is now a pt of Dr Herb Grays.

## 2012-09-07 NOTE — Progress Notes (Signed)
This is a 62 year old Caucasian female with an asymptomatic hepatic nodule which is been followed over the last several years and felt to be an hemangioma. She currently is asymptomatic and denies abdominal pain or any specific hepatobiliary complaints.  Ultrasound was repeated in November which showed a 4 x 4 centimeter nodule in the right hepatic lobe with diffuse fatty infiltration of her liver.  Liver function tests that time were normal.  There is no history of known liver gallbladder, or pancreatic disease.  Is on multiple medications listed and reviewed including Synthroid.  She denies any history of hyperlipidemia, hepatitis, or family history of liver disease.  She also suffers from psoriasis-type skin rash followed by rheumatology.  Current Medications, Allergies, Past Medical History, Past Surgical History, Family History and Social History were reviewed in [employer] record.  ROS: All systems were reviewed and are negative unless otherwise stated in the HPI.          Physical Exam: At pressure 124/74, pulse 84, and weight 252 with a BMI of 38.42.  I cannot appreciate stigmata of chronic liver disease.  There is no organomegaly, abdominal masses or tenderness.  Bowel sounds are normal.  Mental status is normal.    Assessment and Plan: Asymptomatic right hepatic lobe hemangioma with probable chronic underlying mild fatty infiltration of the liver. Review shows no evidence of chronic liver disease by laboratory testing or physical exam.  We will repeat her liver function tests, check lipid profile, alpha-fetoprotein, and complete her workup with MRI of her liver.  If this confirms hemangioma, recommend yearly liver function tests.  Patient does have psoriasis, and apparently is considering initiation of biological therapy per rheumatology.  She does have a rash on her forearms it was noted today, but she denies complications from psoriasis such as arthritis, colitis,  et Karie Soda.  She does take Prilosec 40 mg a day for acid reflux, and is on vitamin D. with calcium supplementation.  His copy this note to Dr. Birdie Sons and primary care Encounter Diagnosis  Name Primary?  . Abnormal ultrasound Yes

## 2012-09-08 ENCOUNTER — Other Ambulatory Visit (INDEPENDENT_AMBULATORY_CARE_PROVIDER_SITE_OTHER): Payer: 59

## 2012-09-08 DIAGNOSIS — R9389 Abnormal findings on diagnostic imaging of other specified body structures: Secondary | ICD-10-CM

## 2012-09-08 LAB — IBC PANEL
Iron: 52 ug/dL (ref 42–145)
Transferrin: 216.4 mg/dL (ref 212.0–360.0)

## 2012-09-08 LAB — LIPID PANEL
Cholesterol: 175 mg/dL (ref 0–200)
LDL Cholesterol: 110 mg/dL — ABNORMAL HIGH (ref 0–99)
Total CHOL/HDL Ratio: 4

## 2012-09-08 LAB — VITAMIN B12: Vitamin B-12: 390 pg/mL (ref 211–911)

## 2012-09-10 ENCOUNTER — Other Ambulatory Visit (HOSPITAL_COMMUNITY): Payer: 59

## 2012-09-10 ENCOUNTER — Ambulatory Visit (HOSPITAL_COMMUNITY)
Admission: RE | Admit: 2012-09-10 | Discharge: 2012-09-10 | Disposition: A | Discharge status: Home / Self Care | Payer: 59 | Source: Ambulatory Visit | Attending: Gastroenterology | Admitting: Gastroenterology

## 2012-09-10 DIAGNOSIS — K5710 Diverticulosis of small intestine without perforation or abscess without bleeding: Secondary | ICD-10-CM | POA: Insufficient documentation

## 2012-09-10 DIAGNOSIS — K7689 Other specified diseases of liver: Secondary | ICD-10-CM | POA: Insufficient documentation

## 2012-09-10 DIAGNOSIS — K769 Liver disease, unspecified: Secondary | ICD-10-CM | POA: Insufficient documentation

## 2012-09-10 DIAGNOSIS — D1803 Hemangioma of intra-abdominal structures: Secondary | ICD-10-CM | POA: Insufficient documentation

## 2012-09-10 DIAGNOSIS — R9389 Abnormal findings on diagnostic imaging of other specified body structures: Secondary | ICD-10-CM

## 2012-09-10 LAB — CREATININE, SERUM
GFR calc Af Amer: >90 mL/min (ref >90)
GFR calc non Af Amer: 88 mL/min — ABNORMAL LOW (ref >90)

## 2012-09-10 MED ORDER — GADOBENATE DIMEGLUMINE 529 MG/ML IV SOLN
20.0000 mL | Freq: Once | INTRAVENOUS | Status: AC | PRN
  Administered 2012-09-10: 20 mL via INTRAVENOUS

## 2012-09-14 ENCOUNTER — Other Ambulatory Visit: Payer: Self-pay

## 2012-09-14 DIAGNOSIS — Z1231 Encounter for screening mammogram for malignant neoplasm of breast: Secondary | ICD-10-CM

## 2012-09-20 ENCOUNTER — Other Ambulatory Visit: Payer: Self-pay | Admitting: *Deleted

## 2012-09-20 DIAGNOSIS — D1803 Hemangioma of intra-abdominal structures: Secondary | ICD-10-CM

## 2012-09-20 DIAGNOSIS — K760 Fatty (change of) liver, not elsewhere classified: Secondary | ICD-10-CM

## 2012-09-20 DIAGNOSIS — R945 Abnormal results of liver function studies: Secondary | ICD-10-CM

## 2012-10-11 ENCOUNTER — Ambulatory Visit
Admission: RE | Admit: 2012-10-11 | Discharge: 2012-10-11 | Disposition: A | Discharge status: Home / Self Care | Payer: 59 | Source: Ambulatory Visit

## 2012-10-11 DIAGNOSIS — Z1231 Encounter for screening mammogram for malignant neoplasm of breast: Secondary | ICD-10-CM

## 2012-11-08 ENCOUNTER — Other Ambulatory Visit (INDEPENDENT_AMBULATORY_CARE_PROVIDER_SITE_OTHER): Payer: 59

## 2012-11-08 DIAGNOSIS — K7689 Other specified diseases of liver: Secondary | ICD-10-CM

## 2012-11-08 DIAGNOSIS — D1803 Hemangioma of intra-abdominal structures: Secondary | ICD-10-CM

## 2012-11-08 DIAGNOSIS — R7989 Other specified abnormal findings of blood chemistry: Secondary | ICD-10-CM

## 2012-11-08 DIAGNOSIS — R945 Abnormal results of liver function studies: Secondary | ICD-10-CM

## 2012-11-08 DIAGNOSIS — K760 Fatty (change of) liver, not elsewhere classified: Secondary | ICD-10-CM

## 2012-11-08 LAB — HEPATIC FUNCTION PANEL
ALT: 26 U/L (ref 0–35)
AST: 30 U/L (ref 0–37)
Alkaline Phosphatase: 75 U/L (ref 39–117)
Bilirubin, Direct: 0.1 mg/dL (ref 0.0–0.3)
Total Bilirubin: 0.9 mg/dL (ref 0.3–1.2)

## 2012-11-25 ENCOUNTER — Other Ambulatory Visit: Payer: Self-pay | Admitting: Gynecology

## 2012-12-01 LAB — HEPATITIS PANEL, ACUTE
Hep A Total Ab: NEGATIVE
Hep B Surface Ab, Qual: NEGATIVE

## 2013-01-12 ENCOUNTER — Ambulatory Visit (INDEPENDENT_AMBULATORY_CARE_PROVIDER_SITE_OTHER)
Admission: RE | Admit: 2013-01-12 | Discharge: 2013-01-12 | Disposition: A | Discharge status: Home / Self Care | Payer: 59 | Source: Ambulatory Visit | Attending: Family Medicine | Admitting: Family Medicine

## 2013-01-12 ENCOUNTER — Ambulatory Visit (INDEPENDENT_AMBULATORY_CARE_PROVIDER_SITE_OTHER): Payer: 59 | Admitting: Family Medicine

## 2013-01-12 VITALS — BP 132/84 | HR 63 | Wt 250.0 lb

## 2013-01-12 DIAGNOSIS — M25569 Pain in unspecified knee: Secondary | ICD-10-CM

## 2013-01-12 DIAGNOSIS — M6580 Other synovitis and tenosynovitis, unspecified site: Secondary | ICD-10-CM

## 2013-01-12 DIAGNOSIS — M65969 Unspecified synovitis and tenosynovitis, unspecified lower leg: Secondary | ICD-10-CM | POA: Insufficient documentation

## 2013-01-12 DIAGNOSIS — M25561 Pain in right knee: Secondary | ICD-10-CM

## 2013-01-12 DIAGNOSIS — M659 Synovitis and tenosynovitis, unspecified: Secondary | ICD-10-CM | POA: Insufficient documentation

## 2013-01-12 MED ORDER — MELOXICAM 15 MG PO TABS: 15.0000 mg | ORAL_TABLET | Freq: Every day | ORAL | Status: DC

## 2013-01-12 NOTE — Progress Notes (Signed)
New Patient to me.   CC: Right knee pain  HPI: Patient is a very pleasant 62 year old female who is coming in with a complaint of right knee pain. Patient states 2 months ago she did have a dog bumped her knee and since then it seems to be worsening. Patient states the pain is mostly on the lateral aspect as well as superior to the knee. Patient describes the pain as a dull aching pain was sharp pain when going downstairs. Patient states that it is worse when she is on her feet for significant amount of time or going up or downstairs. Patient states that this is associated with swelling but no fevers or chills. Patient has been taking Aleve for the last week which has been somewhat beneficial. Otherwise no other home remedies attempted. Patient states that the severity has gotten worse where it is affecting her regular activities of daily living.  Past medical, surgical, family and social history reviewed. Medications reviewed all in the electronic medical record.   Review of Systems: No headache, visual changes, nausea, vomiting, diarrhea, constipation, dizziness, abdominal pain, skin rash, fevers, chills, night sweats, weight loss, swollen lymph nodes, body aches, joint swelling, muscle aches, chest pain, shortness of breath, mood changes.   Objective:    Blood pressure 132/84, pulse 63, weight 250 lb (113.399 kg), SpO2 98.00%.   General: No apparent distress alert and oriented x3 mood and affect normal Respiratory: Patient's speak in full sentences and does not appear short of breath Skin: Warm dry intact with no signs of infection or rash Neuro: Cranial nerves II through XII are intact, neurovascularly intact in all extremities with 2+ DTRs and 2+ pulses. Knee: Right Normal to inspection with no erythema but +1 effusion  Palpation normal with no warmth, patient does have mild joint line tenderness ROM full in flexion and extension and lower leg rotation. Ligaments with solid consistent  endpoints including ACL, PCL, LCL, MCL. Negative Mcmurray's, Apley's, and Thessalonian tests. Mild painful patellar compression. Patellar glide with crepitus. Patellar and quadriceps tendons unremarkable. Hamstring and quadriceps strength is normal.   X-rays were ordered reviewed and interpreted by me today. 3 views the patient's right knee shows mild to moderate osteophytic changes but no gross abnormalities.  Procedure: Limited ultrasound of right knee Device: GE logiq E. Findings: Patient's patella and quadriceps tendons are unremarkable. Patient though does have a significant synovitis noted in the prepatellar area. There is some fluid as well. No signs of significant meniscal injury but does have some degenerative tearing. Impression: Images permanently stored in the unit and are available for review.  Impression and Recommendations:    Synovitis of knee Patient's physical exam as well as ultrasound is consistent with a synovitis of the knee. Patient does have past medical history for psoriasis and concern for psoriatic arthritis. Procedure note After informed written and verbal consent, patient was seated on exam table. Right knee was prepped with alcohol swab and utilizing anterolateral approach, patient's right knee space was injected with 2:2:1  Lidocaine 1% :marcaine 0.5%: 1 ccKenalog 40mg /dL. Patient tolerated the procedure well without immediate complications. Home exercise program given shown exercises Meloxicam 15 mg daily for the next 10 days warned of potential side effects. Discuss patient's allergy previously and patient told that she would like to try medication. Patient was given a knee sleeve that should be beneficial and told and shown proper adjustment and wear Patient will start formal physical therapy Patient will return again in 3-4 weeks for further  evaluation at that time if she continues to have problems she may be candidate for viscous  supplementation.    This case required medical decision making of moderate complexity.

## 2013-01-12 NOTE — Patient Instructions (Addendum)
Very nice to meet you  Your new exercises will ocnsist of the handout and the following.  Isometric contractions of thigh - 10 x 10 secs Straight leg raises - build to 3 sets of 30 and then add weights beginning with 2 lbs and increasing weight by 2 lbs when 3 x 30 reached and is easy to accomplish. Goal is 10 lbs. Drop squats - limit to 45 deg 3 sets of 20 Biking and swimming are great Go to physical therapy, they will call you  Icing 20 minutes 3 times a day could be helpful.   Come back again in 3-4 week sand we will discuss viscous supplementation

## 2013-01-12 NOTE — Assessment & Plan Note (Signed)
Patient's physical exam as well as ultrasound is consistent with a synovitis of the knee. Patient does have past medical history for psoriasis and concern for psoriatic arthritis. Procedure note After informed written and verbal consent, patient was seated on exam table. Right knee was prepped with alcohol swab and utilizing anterolateral approach, patient's right knee space was injected with 2:2:1  Lidocaine 1% :marcaine 0.5%: 1 ccKenalog 40mg /dL. Patient tolerated the procedure well without immediate complications. Home exercise program given shown exercises Meloxicam 15 mg daily for the next 10 days warned of potential side effects. Discuss patient's allergy previously and patient told that she would like to try medication. Patient was given a knee sleeve that should be beneficial and told and shown proper adjustment and wear Patient will start formal physical therapy Patient will return again in 3-4 weeks for further evaluation at that time if she continues to have problems she may be candidate for viscous supplementation.

## 2013-01-20 ENCOUNTER — Other Ambulatory Visit: Payer: Self-pay | Admitting: *Deleted

## 2013-01-20 DIAGNOSIS — M25561 Pain in right knee: Secondary | ICD-10-CM

## 2013-02-09 ENCOUNTER — Encounter: Payer: Self-pay | Admitting: Family Medicine

## 2013-02-09 ENCOUNTER — Ambulatory Visit (INDEPENDENT_AMBULATORY_CARE_PROVIDER_SITE_OTHER): Payer: 59 | Admitting: Family Medicine

## 2013-02-09 VITALS — BP 132/78 | HR 59

## 2013-02-09 DIAGNOSIS — M659 Synovitis and tenosynovitis, unspecified: Secondary | ICD-10-CM

## 2013-02-09 DIAGNOSIS — M6789 Other specified disorders of synovium and tendon, multiple sites: Secondary | ICD-10-CM

## 2013-02-09 DIAGNOSIS — M6580 Other synovitis and tenosynovitis, unspecified site: Secondary | ICD-10-CM

## 2013-02-09 DIAGNOSIS — M67879 Other specified disorders of synovium and tendon, unspecified ankle and foot: Secondary | ICD-10-CM | POA: Insufficient documentation

## 2013-02-09 NOTE — Assessment & Plan Note (Signed)
  Achilles tendinosis  Using an anatomical model, I reviewed with the patient the structures involved and how they related to their diagnosis. The patient indicated that they understood our discussion and the anatomy involved.   Ice massage 2-3 times a day Rehab as described in p/i, toe raises, pidgeon toed and walking pidgeon toed as well as stair exercises  No brace given at this time but discussed heel lifts. Patient is going to formal physical therapy and we'll ask for other home exercises that could be beneficial. If it does not seem to make too much improvement over the course of the next 6 weeks we will consider nitroglycerin patch.

## 2013-02-09 NOTE — Progress Notes (Signed)
Subjective:    CC: Knee pain into left heel pain  HPI: Patient is here for followup of her right knee pain. At last visit patient was diagnosed with synovitis of the right knee likely secondary to psoriatic arthritis. Patient did have a corticosteroid injection, given a brace, start formal physical therapy. Patient states since that time she has made approximately 50-60% improvement. Patient is very happy with the results so far. Patient is still having some mild discomfort on the inferior lateral aspect of the knee. Patient notices it when she does too much activity. Patient states that the swelling has decreased considerably. Denies any nighttime awakening, denies any radiation denies any giving out on her. Patient is coming in with a new problem no. Patient has started having lateral heel pain. Patient states that another provider told her it could be possibly a cyst. This seems to be over her Achilles. Patient states that it is somewhat sore but no swelling. Patient states that it is not changing any of her daily activities. Patient does state that she has a dull sensation in this area though. She denies any radiation, denies any numbness. Patient denies any redness or skin changes.  Past medical history, Surgical history, Family history not pertinant except as noted below, Social history, Allergies, and medications have been entered into the medical record, reviewed, and no changes needed.   Review of Systems: No fevers, chills, night sweats, weight loss, chest pain, or shortness of breath.   Objective:   Blood pressure 132/78, pulse 59, SpO2 99.00%.  General: Well Developed, well nourished, and in no acute distress.  Neuro: Alert and oriented x3, extra-ocular muscles intact, sensation grossly intact.  HEENT: Normocephalic, atraumatic, pupils equal round reactive to light, neck supple, no masses, no lymphadenopathy, thyroid nonpalpable.  Skin: Warm and dry, no rashes. Cardiac:  no lower  extremity edema. Respiratory: Not using accessory muscles, speaking in full sentences. Abdominal: NT, soft Gait: Nonantlagic, good balance and coordination Lymphatic: no lymphadenopathy in neck or axillae on palpation, non tender.  Musculoskeletal: Inspection and palpation of the right and left upper extremities including the shoulders elbows and wrist are unremarkable with full range of motion and good muscle strength and tone. Knee: Right Normal to inspection with no erythema or effusion or obvious bony abnormalities. Still mild joint line tenderness on the lateral aspect of the right knee ROM full in flexion and extension and lower leg rotation. Ligaments with solid consistent endpoints including ACL, PCL, LCL, MCL. Negative Mcmurray's, Apley's, and Thessalonian tests. Non painful patellar compression. Patellar glide with mild crepitus Patellar and quadriceps tendons unremarkable. Hamstring and quadriceps strength is normal but could be potentially we could then contralateral side. Contralateral side normal with all range of motion, strength and tone. Ankle: Left Patient has a nodule of the Achilles tendon approximately 3 cm from the insertion. This is mildly tender to palpation Range of motion is full in all directions. Strength is 5/5 in all directions. Stable lateral and medial ligaments; squeeze test and kleiger test unremarkable; Talar dome nontender; No pain at base of 5th MT; No tenderness over cuboid; No tenderness over N spot or navicular prominence No tenderness on posterior aspects of lateral and medial malleolus No sign of peroneal tendon subluxations or tenderness to palpation Negative tarsal tunnel tinel's Able to walk 4 steps. Contralateral side shows the patient has a small nodule at this point as well the this is nontender on exam otherwise exam is completely unremarkable  MSK US performed of:  Her left ankle This study was ordered, performed, and interpreted by  Terrilee Files D.O.  Foot/Ankle:   All structures visualized.   Talar dome unremarkable  Ankle mortise without effusion. Peroneus longus and brevis tendons unremarkable on long and transverse views without sheath effusions. Posterior tibialis, flexor hallucis longus, and flexor digitorum longus tendons unremarkable on long and transverse views without sheath effusions. Achilles tendon visualized along length of tendon she does have enlargement of a nodule within the tendon itself approximately 3 cm from insertion. There is mild hypoechoic changes but no neovascularization in no true tearing appreciated.  Anterior Talofibular Ligament and Calcaneofibular Ligaments unremarkable and intact. Deltoid Ligament unremarkable and intact. Plantar fascia intact and without effusion, normal thickness. No increased doppler signal, cap sign, or thickening of tibial cortex. Power doppler signal normal.  IMPRESSION:  Achilles tendon nodule    Impression and Recommendations:

## 2013-02-09 NOTE — Patient Instructions (Addendum)
I am glad your knee is getting better Focus on stretching, strengthening at physical Therapy.  Tell Dustin Hi Ice after activity.  Achilles Rehab  Begin with easy walking, heel, toe and backwards  Calf raises on a step First lower and then raise on 1 foot If this is painful lower on 1 foot but do the heel raise on both feet  Begin with 3 sets of 10 repetitions  Increase by 5 repetitions every 3 days  Goal is 3 sets of 30 repetitions  Do with both knee straight and knee at 20 degrees of flexion  If pain persists at 3 sets of 30 - add backpack with 5 lbs Increase by 5 lbs per week to max of 30 lbs Come back in 6 weeks.   Psoriatic arthritis with Synovitis.

## 2013-02-09 NOTE — Assessment & Plan Note (Signed)
Seems improved at this time. Continue with physical therapy with [facility] Continue to wear the brace for comfort Encourage strengthening of the muscles especially quadriceps surrounding the knee for to splinting I think secondary to the synovitis likely been related to her autoimmune disease optimal control will be the most importance. Followup in 6 weeks

## 2013-03-23 ENCOUNTER — Ambulatory Visit (INDEPENDENT_AMBULATORY_CARE_PROVIDER_SITE_OTHER): Payer: 59 | Admitting: Family Medicine

## 2013-03-23 ENCOUNTER — Encounter: Payer: Self-pay | Admitting: Family Medicine

## 2013-03-23 VITALS — BP 134/84 | HR 60

## 2013-03-23 DIAGNOSIS — IMO0002 Reserved for concepts with insufficient information to code with codable children: Secondary | ICD-10-CM

## 2013-03-23 DIAGNOSIS — M7050 Other bursitis of knee, unspecified knee: Secondary | ICD-10-CM

## 2013-03-23 NOTE — Progress Notes (Signed)
  Subjective:    CC: Followup right knee pain  HPI: Patient is a right knee pain has been considerably better. Patient needs to go to physical therapy. Patient that did go to the beach the last week and did do significant amount walking. Patient was not wearing her brace. Patient states that she is having more pain on the medial aspect of her knee the lateral aspect of her knee. Patient did tell her physical therapist who stated that she felt that a knee replacement was necessary. Patient describes the pain as more of a dull aching sensation that can be throbbing. Does have some tightness in the posterior aspect of the knee. Patient states meloxicam does seem to help.  Past medical history, Surgical history, Family history not pertinant except as noted below, Social history, Allergies, and medications have been entered into the medical record, reviewed, and no changes needed.   Review of Systems: No fevers, chills, night sweats, weight loss, chest pain, or shortness of breath.   Objective:   Blood pressure 134/84, pulse 60, SpO2 97.00%.  General: Well Developed, well nourished, and in no acute distress.  Neuro: Alert and oriented x3, extra-ocular muscles intact, sensation grossly intact.  HEENT: Normocephalic, atraumatic, pupils equal round reactive to light, neck supple, no masses, no lymphadenopathy, thyroid nonpalpable.  Skin: Warm and dry, no rashes. Cardiac:  no lower extremity edema. Respiratory: Not using accessory muscles, speaking in full sentences. Abdominal: NT, soft Gait: Nonantlagic, good balance and coordination Lymphatic: no lymphadenopathy in neck or axillae on palpation, non tender.  Musculoskeletal: Inspection and palpation of the right and left upper extremities including the shoulders elbows and wrist are unremarkable with full range of motion and good muscle strength and tone. Inspection and palpation of the right and left lower extremities including the hips and ankles  are unremarkable and nontender with full range of motion and good muscle strength and tone and are symmetric. Knee: Right  Normal to inspection with no erythema or effusion or obvious bony abnormalities.  More tender over the pes anserine ROM full in flexion and extension and lower leg rotation.  Ligaments with solid consistent endpoints including ACL, PCL, LCL, MCL.  Negative Mcmurray's, Apley's, and Thessalonian tests.  Non painful patellar compression.  Patellar glide with mild crepitus  Patellar and quadriceps tendons unremarkable.  Hamstring and quadriceps strength is normal but could be potentially we could then contralateral side.  Contralateral side normal with all range of motion, strength and tone.   Impression and Recommendations:

## 2013-03-23 NOTE — Assessment & Plan Note (Signed)
Patient does have some pedis anserine bursitis. Patient will try conservative approach with exercise. Patient will add in the regimen with her physical therapist. We discussed compression. Discuss the patient should probably be wearing her knee brace with long duration activities. Discussed icing protocol Will try meloxicam daily for 5 days and then as needed. Will come back and see me again in 2-3 weeks if not making significant improvement and can do a steroid injection.

## 2013-03-23 NOTE — Patient Instructions (Signed)
Very good to see you Ice 20 minutes 3 times a day.   Can try compression on legs if you want Do exercises daily and give handout to PT to show them new diagnosis.  Come back in 2 weeks or just stop byu and tell me how you are doing, we can do an injection if needed .

## 2013-03-24 ENCOUNTER — Other Ambulatory Visit: Payer: Self-pay | Admitting: Endocrinology

## 2013-03-24 ENCOUNTER — Telehealth: Payer: Self-pay | Admitting: *Deleted

## 2013-03-24 DIAGNOSIS — E039 Hypothyroidism, unspecified: Secondary | ICD-10-CM

## 2013-03-24 NOTE — Telephone Encounter (Signed)
Pt called stating she has an appt scheduled for Nov. 20th. She wants to know if she needs to have labs done prior to her appt. If so, pt would like the labs put in to be done at the lab at the [facility] location. Please advise.

## 2013-03-24 NOTE — Telephone Encounter (Signed)
Free T4 and TSH; dx 244.9

## 2013-03-24 NOTE — Telephone Encounter (Signed)
Put in TSH and free, T4 labs for pt.

## 2013-04-11 ENCOUNTER — Ambulatory Visit (INDEPENDENT_AMBULATORY_CARE_PROVIDER_SITE_OTHER): Payer: Commercial Managed Care - PPO | Admitting: General Surgery

## 2013-04-11 ENCOUNTER — Encounter (INDEPENDENT_AMBULATORY_CARE_PROVIDER_SITE_OTHER): Payer: Self-pay | Admitting: General Surgery

## 2013-04-11 VITALS — BP 130/78 | HR 76 | Resp 16 | Ht 69.0 in | Wt 245.4 lb

## 2013-04-11 DIAGNOSIS — D171 Benign lipomatous neoplasm of skin and subcutaneous tissue of trunk: Secondary | ICD-10-CM | POA: Insufficient documentation

## 2013-04-11 DIAGNOSIS — D1779 Benign lipomatous neoplasm of other sites: Secondary | ICD-10-CM

## 2013-04-11 NOTE — Progress Notes (Signed)
Patient ID: [name] [name], female   DOB: [birth year], 62 y.o.   MRN: [id number]  Chief Complaint  Patient presents with  . New Evaluation    eval lipoma on lower back    HPI [name] [name] is a 62 y.o. female.  Referred by Dr Herb Grays HPI This is a 62 year old female who has a history of psoriasis and is about to begin biologic therapy for that. She has had a 20 year history of a back mass that has slowly gotten larger in size over that time period and this area is minimally symptomatic only when she is driving at times. It really does not bother her too much at all. He has never had a history of any infection. It has not had a period of rapid growth. She comes in today to discuss a possible excision of this and to see if that is necessary. Past Medical History  Diagnosis Date  . Hypothyroidism   . Osteoporosis   . Rosacea   . Granuloma annulare   . Psoriasis   . Dyslipidemia   . Multinodular goiter   . Fibromyalgia   . Anxiety   . Diverticulosis of colon (without mention of hemorrhage)   . Family history of malignant neoplasm of gastrointestinal tract   . Snores   . GERD (gastroesophageal reflux disease)   . Arthritis     Past Surgical History  Procedure Laterality Date  . Tubal ligation    . Knee surgery      left  . Tonsillectomy    . Upper gastrointestinal endoscopy  2013    esoph dilation  . Colonoscopy  2013  . Mass excision  02/27/2012    Procedure: EXCISION MASS;  Surgeon: Marlowe Shores, MD;  Location: [facility];  Service: Orthopedics;  Laterality: Left;  excision of left forearm/wrist mass  . Wrist surgery      Family History  Problem Relation Age of Onset  . Colon cancer Father   . Cancer Father     colon  . Diverticulitis Mother   . Heart disease Mother   . Prostate cancer Maternal Uncle   . Breast cancer      great aunt  . Diabetes Paternal Grandfather   . Colon cancer Paternal Grandmother     Social History History    Substance Use Topics  . Smoking status: Never Smoker   . Smokeless tobacco: Never Used  . Alcohol Use: Yes     Comment: wine, beer, mixed drinks on weekends    Allergies  Allergen Reactions  . Erythromycin   . Nabumetone     Current Outpatient Prescriptions  Medication Sig Dispense Refill  . aspirin 81 MG tablet Take 81 mg by mouth daily.        . Calcitriol (VECTICAL) 3 MCG/GM cream Apply topically at bedtime.      . calcium-vitamin D (CALCIUM 500+D) 500-200 MG-UNIT per tablet Take 1 tablet by mouth daily.        . cetirizine (ZYRTEC) 10 MG tablet Take 10 mg by mouth as needed.      . clobetasol ointment (TEMOVATE) 0.05 % Apply topically as needed.      . cyclobenzaprine (FLEXERIL) 10 MG tablet Take 10 mg by mouth as needed.      . diclofenac sodium (VOLTAREN) 1 % GEL Apply 1 application topically as needed.      . folic acid (FOLVITE) 1 MG tablet Take 1 mg by mouth daily.        Marland Kitchen  levothyroxine (SYNTHROID, LEVOTHROID) 100 MCG tablet Take 112 mcg by mouth daily.       . meloxicam (MOBIC) 15 MG tablet Take 1 tablet (15 mg total) by mouth daily. For 10 days then as needed.  30 tablet  2  . Multiple Vitamin (MULTIVITAMIN) capsule Take 1 capsule by mouth daily.        Marland Kitchen omeprazole (PRILOSEC) 40 MG capsule Take 40 mg by mouth daily.      . ValACYclovir HCl (VALTREX PO) Take 1 g by mouth as needed. Take 1 gram daily       No current facility-administered medications for this visit.    Review of Systems Review of Systems  Constitutional: Negative for fever, chills and unexpected weight change.  HENT: Negative for congestion, hearing loss, sore throat, trouble swallowing and voice change.   Eyes: Negative for visual disturbance.  Respiratory: Negative for cough and wheezing.   Cardiovascular: Negative for chest pain, palpitations and leg swelling.  Gastrointestinal: Negative for nausea, vomiting, abdominal pain, diarrhea, constipation, blood in stool, abdominal distention and anal  bleeding.  Genitourinary: Negative for hematuria, vaginal bleeding and difficulty urinating.  Musculoskeletal: Negative for arthralgias.  Skin: Positive for rash. Negative for wound.  Neurological: Negative for seizures, syncope and headaches.  Hematological: Negative for adenopathy. Does not bruise/bleed easily.  Psychiatric/Behavioral: Negative for confusion.    Blood pressure 130/78, pulse 76, resp. rate 16, height 5\' 9"  (1.753 m), weight 245 lb 6.1 oz (111.304 kg).  Physical Exam Physical Exam  Vitals reviewed. Constitutional: She appears well-developed and well-nourished.  Pulmonary/Chest:      Data Reviewed Dr Yehuda Budd notes  Assessment    Back lipoma     Plan    This area is pretty clearly clinically a lipoma and that goes along with her history as well. We discussed all the options including observation versus excision. There has been no rapid growth and this is fairly asymptomatic as well as pretty clear clinically that it is a lipoma. I think if she would prefer to have this observed it is a very reasonable plan. I discussed with her if it does become symptomatic or has a period of rapid growth then we should excise it and she will call me back if that's the case.        Montford Barg 04/11/2013, 2:02 PM

## 2013-04-14 ENCOUNTER — Other Ambulatory Visit: Payer: Self-pay

## 2013-04-20 ENCOUNTER — Other Ambulatory Visit (INDEPENDENT_AMBULATORY_CARE_PROVIDER_SITE_OTHER): Payer: 59

## 2013-04-20 DIAGNOSIS — E039 Hypothyroidism, unspecified: Secondary | ICD-10-CM

## 2013-04-20 LAB — T4, FREE: Free T4: 1.05 ng/dL (ref 0.60–1.60)

## 2013-04-28 ENCOUNTER — Ambulatory Visit (INDEPENDENT_AMBULATORY_CARE_PROVIDER_SITE_OTHER): Payer: 59 | Admitting: Endocrinology

## 2013-04-28 ENCOUNTER — Encounter: Payer: Self-pay | Admitting: Endocrinology

## 2013-04-28 VITALS — BP 126/78 | HR 65 | Temp 98.6°F | Resp 12 | Ht 69.0 in | Wt 248.2 lb

## 2013-04-28 DIAGNOSIS — E041 Nontoxic single thyroid nodule: Secondary | ICD-10-CM | POA: Insufficient documentation

## 2013-04-28 DIAGNOSIS — E039 Hypothyroidism, unspecified: Secondary | ICD-10-CM

## 2013-04-28 NOTE — Patient Instructions (Signed)
Continue same dosage before breakfast daily.   Avoid taking any calcium or iron supplements with the thyroid supplement. 

## 2013-04-28 NOTE — Progress Notes (Signed)
Reason for Appointment:  Hypothyroidism, followup visit    History of Present Illness:   The hypothyroidism was first diagnosed in 1969 with symptoms of fatigue and weight gain Complaints are reported by the patient now are none following: fatigue, unexpected weight gain , cold sensitivity, difficulty concentrating , dry skin, hair loss.           The treatments that the patient has taken include Synthroid brand name.   In 07/2012 her TSH was over 5 and the dose was increased to 112 mcg. TSH was normal in 5/14          Compliance with the medical regimen has been as prescribed with taking the tablet in the morning before breakfast. She does take her calcium after breakfast  No visits with results within 1 Week(s) from this visit. Latest known visit with results is:  Appointment on 04/20/2013  Component Date Value Range Status  . TSH 04/20/2013 2.41  0.35 - 5.50 uIU/mL Final  . Free T4 04/20/2013 1.05  0.60 - 1.60 ng/dL Final   Problem #2: THYROID nodule: This is long-standing dating 2005 were needle aspiration showed colloid nodule with benign follicular cells. Had no local pressure sensation or choking     Medication List       This list is accurate as of: 04/28/13  2:31 PM.  Always use your most recent med list.               aspirin 81 MG tablet  Take 81 mg by mouth daily.     CALCIUM 500+D 500-200 MG-UNIT per tablet  Generic drug:  calcium-vitamin D  Take 1 tablet by mouth daily.     cetirizine 10 MG tablet  Commonly known as:  ZYRTEC  Take 10 mg by mouth as needed.     clobetasol ointment 0.05 %  Commonly known as:  TEMOVATE  Apply topically as needed.     FLEXERIL 10 MG tablet  Generic drug:  cyclobenzaprine  Take 10 mg by mouth as needed.     folic acid 1 MG tablet  Commonly known as:  FOLVITE  Take 1 mg by mouth daily.     levothyroxine 100 MCG tablet  Commonly known as:  SYNTHROID, LEVOTHROID  Take 112 mcg by mouth daily.     meloxicam 15 MG tablet   Commonly known as:  MOBIC  Take 1 tablet (15 mg total) by mouth daily. For 10 days then as needed.     multivitamin capsule  Take 1 capsule by mouth daily.     omeprazole 40 MG capsule  Commonly known as:  PRILOSEC  Take 40 mg by mouth daily.     VALTREX PO  Take 1 g by mouth as needed. Take 1 gram daily     VECTICAL 3 MCG/GM cream  Generic drug:  Calcitriol  Apply topically at bedtime.     VOLTAREN 1 % Gel  Generic drug:  diclofenac sodium  Apply 1 application topically as needed.        Allergies:  Allergies  Allergen Reactions  . Erythromycin   . Nabumetone     Past Medical History  Diagnosis Date  . Hypothyroidism   . Osteoporosis   . Rosacea   . Granuloma annulare   . Psoriasis   . Dyslipidemia   . Multinodular goiter   . Fibromyalgia   . Anxiety   . Diverticulosis of colon (without mention of hemorrhage)   . Family history of malignant neoplasm  of gastrointestinal tract   . Snores   . GERD (gastroesophageal reflux disease)   . Arthritis     Past Surgical History  Procedure Laterality Date  . Tubal ligation    . Knee surgery      left  . Tonsillectomy    . Upper gastrointestinal endoscopy  2013    esoph dilation  . Colonoscopy  2013  . Mass excision  02/27/2012    Procedure: EXCISION MASS;  Surgeon: Marlowe Shores, MD;  Location: [facility];  Service: Orthopedics;  Laterality: Left;  excision of left forearm/wrist mass  . Wrist surgery      Family History  Problem Relation Age of Onset  . Colon cancer Father   . Cancer Father     colon  . Diverticulitis Mother   . Heart disease Mother   . Prostate cancer Maternal Uncle   . Breast cancer      great aunt  . Diabetes Paternal Grandfather   . Colon cancer Paternal Grandmother     Social History:  reports that she has never smoked. She has never used smokeless tobacco. She reports that she drinks alcohol. She reports that she does not use illicit drugs.  REVIEW Of  SYSTEMS:   Examination:   BP 126/78  Pulse 65  Temp(Src) 98.6 F (37 C)  Resp 12  Ht 5\' 9"  (1.753 m)  Wt 248 lb 3.2 oz (112.583 kg)  BMI 36.64 kg/m2  SpO2 96%   GENERAL APPEARANCE: Alert And looks well.        NECK: Left-sided nodule measures   2.5 cm, smooth and firm, mobile. No lymphadenopathy in the neck        NEUROLOGIC EXAM: Biceps reflexes show normal relaxation No ankle edema    Assessments   1. Hypothyroidism primary, long-standing, clinically doing well and has normal TSH with current dose of 112 mcg Synthroid. Compliance with the medication is excellent. However although she does not appear to have any interaction with her calcium supplement she should take this at lunch and supper instead of in the morning  2. Benign left-sided thyroid nodule, clinically unchanged from previous exam. This is long-standing and stable   Treatment:   Continue same dosage before breakfast daily. Avoid taking any calcium or iron supplements with the thyroid supplement.    Follow the left-sided thyroid nodule clinically  She will see me back in 6 months  Essie Gehret 04/28/2013, 2:31 PM

## 2013-04-29 ENCOUNTER — Other Ambulatory Visit: Payer: Self-pay | Admitting: *Deleted

## 2013-04-29 MED ORDER — LEVOTHYROXINE SODIUM 112 MCG PO TABS: 112.0000 ug | ORAL_TABLET | Freq: Every day | ORAL | Status: DC

## 2013-09-14 ENCOUNTER — Encounter: Payer: Self-pay | Admitting: Family Medicine

## 2013-09-14 ENCOUNTER — Ambulatory Visit (INDEPENDENT_AMBULATORY_CARE_PROVIDER_SITE_OTHER): Payer: 59 | Admitting: Family Medicine

## 2013-09-14 VITALS — BP 148/90 | HR 74

## 2013-09-14 DIAGNOSIS — M542 Cervicalgia: Secondary | ICD-10-CM

## 2013-09-14 DIAGNOSIS — M9981 Other biomechanical lesions of cervical region: Secondary | ICD-10-CM

## 2013-09-14 DIAGNOSIS — M999 Biomechanical lesion, unspecified: Secondary | ICD-10-CM

## 2013-09-14 DIAGNOSIS — M549 Dorsalgia, unspecified: Secondary | ICD-10-CM

## 2013-09-14 NOTE — Assessment & Plan Note (Signed)
Decision today to treat with OMT was based on Physical Exam  After verbal consent patient was treated with HVLA ME techniques in cervical, thoracic, lumbar and sacral areas  Patient tolerated the procedure well with improvement in symptoms  Patient given exercises, stretches and lifestyle modifications  See medications in patient instructions if given  Patient will follow up in 3 weeks

## 2013-09-14 NOTE — Patient Instructions (Signed)
Vitamin D 2000 IU daily.  Turmeric 5000mg  twice daily.  Try these exercises 3 times a week at least With your chair put tennis ball between shoulder blades Posture on wall.  Hells, butt, shoulder and head on wall for goal of 5 minutes daily.  Heel lift in right shoe at all times.  Come back 3 weeks or so for another manipulation.

## 2013-09-14 NOTE — Progress Notes (Signed)
[name] [address], [place] [address] Phone: [phone] Subjective:     CC: neck and back pain.   BPZ:WCHENIDPOE [name] [name] is a 63 y.o. female coming in with complaint of neck and back pain. Patient's neck she notices it more at the end of a long day. Patient was told by her chiropractor that she did have some compression. Patient is concerned because she feels that she has actually lost an inch in height as well. Patient states it more the pain seems to be on the right side. Denies any radiation into the arm or any numbness or weakness. Patient states is more of a discomfort but does respond fairly well over-the-counter medications. Patient does have a past medical history significant for hypothyroidism as well as fibromyalgia. Denies fevers or chills or any abnormal weight loss and denies any significant headaches. Patient states it does seem to be worse with certain whether. Rates pain 5/10.  Patient's low back pain is also at the end of the long day after sitting for significant amount time. Patient denies any radiation to the legs any numbness or weakness. Patient states it's more secondary to soreness and patient states possibly her weight. Patient has been wearing better shoes and doing exercises for her knee which has been very helpful. Patient rates his pain as 4/10 it seems to be chronic and irritating but denies any nighttime awakening.     Past medical history, social, surgical and family history all reviewed in electronic medical record.   Review of Systems: No headache, visual changes, nausea, vomiting, diarrhea, constipation, dizziness, abdominal pain, skin rash, fevers, chills, night sweats, weight loss, swollen lymph nodes, body aches, joint swelling, muscle aches, chest pain, shortness of breath, mood changes.   Objective Blood pressure 148/90, pulse 74, SpO2 98.00%.  General: No apparent distress alert and oriented x3 mood  and affect normal, dressed appropriately.  HEENT: Pupils equal, extraocular movements intact  Respiratory: Patient's speak in full sentences and does not appear short of breath  Cardiovascular: No lower extremity edema, non tender, no erythema  Skin: Warm dry intact with no signs of infection or rash on extremities or on axial skeleton.  Abdomen: Soft nontender  Neuro: Cranial nerves II through XII are intact, neurovascularly intact in all extremities with 2+ DTRs and 2+ pulses.  Lymph: No lymphadenopathy of posterior or anterior cervical chain or axillae bilaterally.  Gait normal with good balance and coordination.  MSK:  Non tender with full range of motion and good stability and symmetric strength and tone of shoulders, elbows, wrist, hip, knee and ankles bilaterally.  Neck: Inspection unremarkable. No palpable stepoffs. Negative Spurling's maneuver. Mild decreased range of motion with rotation and side bending to the left lacking approximately 10 Grip strength and sensation normal in bilateral hands Strength good C4 to T1 distribution No sensory change to C4 to T1 Negative Hoffman sign bilaterally Reflexes normal Back Exam:  Inspection: Unremarkable  Motion: Flexion 45 deg, Extension 45 deg, Side Bending to 45 deg bilaterally,  Rotation to 45 deg bilaterally  SLR laying: Negative  XSLR laying: Negative  Palpable tenderness: Mild paraspinal musculature tenderness on the lumbar spine bilaterally right greater than left FABER: negative. Sensory change: Gross sensation intact to all lumbar and sacral dermatomes.  Reflexes: 2+ at both patellar tendons, 2+ at achilles tendons, Babinski's downgoing.  Strength at foot  Plantar-flexion: 5/5 Dorsi-flexion: 5/5 Eversion: 5/5 Inversion: 5/5  Leg strength  Quad: 5/5 Hamstring:  5/5 Hip flexor: 5/5 Hip abductors: 5/5  Gait unremarkable. Patient does have a leg length discrepancy with right leg quarter inch shorter  OMT Physical  Exam  Standing structural       Occiput right lower  Shoulder right lower   Standing flexion right  Seated Flexion  Cervical  CC to flexed rotated and side bent right C5 to flexed rotated and side bent left  Thoracic T 3 extended rotated and side bent right  Lumbar L2 flexed rotated and side bent right  Sacrum Right on right        Impression and Recommendations:     This case required medical decision making of moderate complexity.

## 2013-09-14 NOTE — Assessment & Plan Note (Signed)
Patient does have some mild neck pain and there could be some underlying arthritis but mostly secondary to patient's postural working position. We discussed changing patient's incision as well as for monitor at work. Patient was given home exercises I think will be beneficial and we discussed over-the-counter medication. Patient did have osteopathic manipulation today and state some improvement. Patient can come back on a regular basis. Patient will come back again in 3 weeks for further evaluation.

## 2013-09-14 NOTE — Assessment & Plan Note (Signed)
Multifactorial secondary to patient's weight and poor course strength. Patient given exercises to improve range of motion we'll do strengthening exercises the next followup. Patient does have Flexeril meloxicam previously. Patient will try over-the-counter medicines for anti-inflammatory effect as well. Patient will come back again in 3 weeks for further evaluation.

## 2013-09-16 ENCOUNTER — Other Ambulatory Visit: Payer: Self-pay

## 2013-09-16 DIAGNOSIS — Z1231 Encounter for screening mammogram for malignant neoplasm of breast: Secondary | ICD-10-CM

## 2013-10-11 ENCOUNTER — Ambulatory Visit: Payer: 59 | Admitting: Family Medicine

## 2013-10-17 ENCOUNTER — Ambulatory Visit
Admission: RE | Admit: 2013-10-17 | Discharge: 2013-10-17 | Disposition: A | Discharge status: Home / Self Care | Payer: 59 | Source: Ambulatory Visit

## 2013-10-17 DIAGNOSIS — Z1231 Encounter for screening mammogram for malignant neoplasm of breast: Secondary | ICD-10-CM

## 2013-10-18 ENCOUNTER — Ambulatory Visit: Payer: 59 | Admitting: Family Medicine

## 2013-10-25 ENCOUNTER — Encounter: Payer: Self-pay | Admitting: Family Medicine

## 2013-10-25 ENCOUNTER — Ambulatory Visit (INDEPENDENT_AMBULATORY_CARE_PROVIDER_SITE_OTHER): Payer: 59 | Admitting: Family Medicine

## 2013-10-25 VITALS — BP 118/80 | HR 61 | Ht 69.0 in | Wt 248.0 lb

## 2013-10-25 DIAGNOSIS — M9981 Other biomechanical lesions of cervical region: Secondary | ICD-10-CM

## 2013-10-25 DIAGNOSIS — M542 Cervicalgia: Secondary | ICD-10-CM

## 2013-10-25 DIAGNOSIS — M999 Biomechanical lesion, unspecified: Secondary | ICD-10-CM

## 2013-10-25 NOTE — Assessment & Plan Note (Signed)
Decision today to treat with OMT was based on Physical Exam  After verbal consent patient was treated with HVLA ME techniques in cervical, thoracic, lumbar and sacral areas  Patient tolerated the procedure well with improvement in symptoms  Patient given exercises, stretches and lifestyle modifications  See medications in patient instructions if given  Patient will follow up in 6 weeks

## 2013-10-25 NOTE — Patient Instructions (Signed)
Good to see you as always.  Continue the exercises.  You are doing great! Come back in 6-8 weeks.

## 2013-10-25 NOTE — Progress Notes (Signed)
[name] [address], [place] [address] Phone: [phone] Subjective:     CC: neck and back pain.   [name] [name] is a 63 y.o. female coming in with complaint of neck and back pain. Patient was seen previously and had more muscle imbalances and we discussed sleep position as well as working position. We discussed over-the-counter medications it to be beneficial as well. Patient does have past medical history significant for fibromyalgia.. Patient was given home exercises and did respond very well to osteopathic manipulation. Patient states she has been doing considerably well. Patient does have intermittent aches and pains but overall has been feeling better. Patient has been doing some of the exercises. Denies any numbness or tingling denies any radiation to any of her child is. Patient states her fibromyalgia has been well-controlled. Overall fairly happy.     Past medical history, social, surgical and family history all reviewed in electronic medical record.   Review of Systems: No headache, visual changes, nausea, vomiting, diarrhea, constipation, dizziness, abdominal pain, skin rash, fevers, chills, night sweats, weight loss, swollen lymph nodes, body aches, joint swelling, muscle aches, chest pain, shortness of breath, mood changes.   Objective Blood pressure 118/80, pulse 61, height 5\' 9"  (1.753 m), weight 248 lb (112.492 kg), SpO2 98.00%.  General: No apparent distress alert and oriented x3 mood and affect normal, dressed appropriately.  HEENT: Pupils equal, extraocular movements intact  Respiratory: Patient's speak in full sentences and does not appear short of breath  Cardiovascular: No lower extremity edema, non tender, no erythema  Skin: Warm dry intact with no signs of infection or rash on extremities or on axial skeleton.  Abdomen: Soft nontender  Neuro: Cranial nerves II through XII are intact, neurovascularly intact  in all extremities with 2+ DTRs and 2+ pulses.  Lymph: No lymphadenopathy of posterior or anterior cervical chain or axillae bilaterally.  Gait normal with good balance and coordination.  MSK:  Non tender with full range of motion and good stability and symmetric strength and tone of shoulders, elbows, wrist, hip, knee and ankles bilaterally.  Neck: Inspection unremarkable. No palpable stepoffs. Negative Spurling's maneuver. Mild decreased range of motion with rotation and side bending to the left lacking approximately 10 Grip strength and sensation normal in bilateral hands Strength good C4 to T1 distribution No sensory change to C4 to T1 Negative Hoffman sign bilaterally Reflexes normal Back Exam:  Inspection: Unremarkable  Motion: Flexion 45 deg, Extension 45 deg, Side Bending to 45 deg bilaterally,  Rotation to 45 deg bilaterally  SLR laying: Negative  XSLR laying: Negative  Palpable tenderness: Mild paraspinal musculature tenderness on the lumbar spine bilaterally right greater than left FABER: negative. Sensory change: Gross sensation intact to all lumbar and sacral dermatomes.  Reflexes: 2+ at both patellar tendons, 2+ at achilles tendons, Babinski's downgoing.  Strength at foot  Plantar-flexion: 5/5 Dorsi-flexion: 5/5 Eversion: 5/5 Inversion: 5/5  Leg strength  Quad: 5/5 Hamstring: 5/5 Hip flexor: 5/5 Hip abductors: 5/5  Gait unremarkable. Patient does have a leg length discrepancy with right leg quarter inch shorter  OMT Physical Exam   Seated Flexion  Cervical  CC to flexed rotated and side bent right C5 to flexed rotated and side bent left  Thoracic T 3 extended rotated and side bent right  Lumbar L2 flexed rotated and side bent right  Sacrum Right on right        Impression and Recommendations:  This case required medical decision making of moderate complexity.

## 2013-10-25 NOTE — Assessment & Plan Note (Signed)
Patient has made some improvement. This is multifactorial with patient fibromyalgia, overweight as well as working position. Patient given other recommendations to do self manipulation at home that could be beneficial in the showed proper technique. Patient will continue all other regimens and come back again in 6 weeks for further evaluation and treatment.  Spent greater than 25 minutes with patient face-to-face and had greater than 50% of counseling including as described above in assessment and plan.

## 2013-11-01 ENCOUNTER — Telehealth: Payer: Self-pay | Admitting: *Deleted

## 2013-11-01 DIAGNOSIS — K760 Fatty (change of) liver, not elsewhere classified: Secondary | ICD-10-CM

## 2013-11-01 NOTE — Telephone Encounter (Signed)
Message copied by Hulan Saas on Tue Nov 01, 2013 11:10 AM ------      Message from: Hope Pigeon A      Created: Thu Jul 28, 2013  1:48 PM      Regarding: Lab       Notes Recorded by Sable Feil, MD on 11/09/2012 at 8:46 AM            Liver function test and MRI are stable.  Would continue with yearly liver function tests.  Be sure Dr. Leanne Chang has a copy of all her test results ------

## 2013-11-01 NOTE — Telephone Encounter (Signed)
Yes, hepatic  Panel , and will need to establish with another GI MD-not urgent

## 2013-11-01 NOTE — Telephone Encounter (Signed)
Amy, Dr. Sharlett Iles wanted this patient to have yearly LFT's. Please, advise if this is ok and if she needs OV also.

## 2013-11-01 NOTE — Telephone Encounter (Signed)
Spoke with patient and she will come for labs. She does not have a preference for new GI. Scheduled with Dr. Hilarie Fredrickson on 12/27/13 at 2:00 PM.

## 2013-11-02 ENCOUNTER — Other Ambulatory Visit (INDEPENDENT_AMBULATORY_CARE_PROVIDER_SITE_OTHER): Payer: 59

## 2013-11-02 ENCOUNTER — Other Ambulatory Visit: Payer: 59

## 2013-11-02 DIAGNOSIS — K7689 Other specified diseases of liver: Secondary | ICD-10-CM

## 2013-11-02 DIAGNOSIS — E039 Hypothyroidism, unspecified: Secondary | ICD-10-CM

## 2013-11-02 DIAGNOSIS — K760 Fatty (change of) liver, not elsewhere classified: Secondary | ICD-10-CM

## 2013-11-02 LAB — HEPATIC FUNCTION PANEL
ALBUMIN: 4 g/dL (ref 3.5–5.2)
ALT: 26 U/L (ref 0–35)
AST: 28 U/L (ref 0–37)
Alkaline Phosphatase: 79 U/L (ref 39–117)
Bilirubin, Direct: 0.2 mg/dL (ref 0.0–0.3)
Total Bilirubin: 0.9 mg/dL (ref 0.2–1.2)
Total Protein: 6.9 g/dL (ref 6.0–8.3)

## 2013-11-02 LAB — TSH: TSH: 3.72 u[IU]/mL (ref 0.35–4.50)

## 2013-11-02 LAB — T4, FREE: Free T4: 0.94 ng/dL (ref 0.60–1.60)

## 2013-11-03 ENCOUNTER — Ambulatory Visit (INDEPENDENT_AMBULATORY_CARE_PROVIDER_SITE_OTHER): Payer: 59 | Admitting: Endocrinology

## 2013-11-03 ENCOUNTER — Encounter: Payer: Self-pay | Admitting: Endocrinology

## 2013-11-03 VITALS — BP 134/78 | HR 67 | Temp 98.6°F | Resp 16 | Ht 69.0 in | Wt 253.0 lb

## 2013-11-03 DIAGNOSIS — E039 Hypothyroidism, unspecified: Secondary | ICD-10-CM

## 2013-11-03 DIAGNOSIS — E041 Nontoxic single thyroid nodule: Secondary | ICD-10-CM

## 2013-11-03 MED ORDER — LEVOTHYROXINE SODIUM 112 MCG PO TABS: 112.0000 ug | ORAL_TABLET | Freq: Every day | ORAL | Status: DC

## 2013-11-03 NOTE — Progress Notes (Signed)
[name] [name] 63 y.o.   Reason for Appointment:  Hypothyroidism, followup visit   History of Present Illness:   The hypothyroidism was first diagnosed in 1969 with symptoms of fatigue and weight gain Complaints are reported by the patient now are none following: Unusual fatigue, unexpected weight gain, cold sensitivity, difficulty concentrating , dry skin, hair loss.           The treatments that the patient has taken include Synthroid brand name.   In 07/2012 her TSH was over 5 and the dose was increased to 112 mcg. TSH was normal subsequently      Compliance with the medical regimen has been as prescribed with taking the tablet in the morning before breakfast. She does take her calcium separately  Lab Results  Component Value Date   FREET4 0.94 11/02/2013   FREET4 1.05 04/20/2013   TSH 3.72 11/02/2013   TSH 2.41 04/20/2013    Problem 2: THYROID nodule: This is long-standing since about 2005 when the needle aspiration showed colloid nodule with benign follicular cells.  She does not feel any local pressure sensation or choking     Medication List       This list is accurate as of: 11/03/13  1:44 PM.  Always use your most recent med list.               aspirin 81 MG tablet  Take 81 mg by mouth daily.     CALCIUM 500+D 500-200 MG-UNIT per tablet  Generic drug:  calcium-vitamin D  Take 1 tablet by mouth daily.     cetirizine 10 MG tablet  Commonly known as:  ZYRTEC  Take 10 mg by mouth as needed.     clobetasol ointment 0.05 %  Commonly known as:  TEMOVATE  Apply topically as needed.     FLEXERIL 10 MG tablet  Generic drug:  cyclobenzaprine  Take 10 mg by mouth as needed.     folic acid 1 MG tablet  Commonly known as:  FOLVITE  Take 1 mg by mouth daily.     levothyroxine 112 MCG tablet  Commonly known as:  SYNTHROID, LEVOTHROID  Take 1 tablet (112 mcg total) by mouth daily.     meloxicam 15 MG tablet  Commonly known as:  MOBIC  Take 1 tablet (15 mg total)  by mouth daily. For 10 days then as needed.     multivitamin capsule  Take 1 capsule by mouth daily.     omeprazole 40 MG capsule  Commonly known as:  PRILOSEC  Take 40 mg by mouth daily.     VALTREX PO  Take 1 g by mouth as needed. Take 1 gram daily     VECTICAL 3 MCG/GM cream  Generic drug:  Calcitriol  Apply topically at bedtime.     VOLTAREN 1 % Gel  Generic drug:  diclofenac sodium  Apply 1 application topically as needed.        Allergies:  Allergies  Allergen Reactions  . Erythromycin   . Nabumetone     Past Medical History  Diagnosis Date  . Hypothyroidism   . Osteoporosis   . Rosacea   . Granuloma annulare   . Psoriasis   . Dyslipidemia   . Multinodular goiter   . Fibromyalgia   . Anxiety   . Diverticulosis of colon (without mention of hemorrhage)   . Family history of malignant neoplasm of gastrointestinal tract   . Snores   . GERD (gastroesophageal reflux  disease)   . Arthritis     Past Surgical History  Procedure Laterality Date  . Tubal ligation    . Knee surgery      left  . Tonsillectomy    . Upper gastrointestinal endoscopy  2013    esoph dilation  . Colonoscopy  2013  . Mass excision  02/27/2012    Procedure: EXCISION MASS;  Surgeon: Schuyler Amor, MD;  Location: [facility];  Service: Orthopedics;  Laterality: Left;  excision of left forearm/wrist mass  . Wrist surgery      Family History  Problem Relation Age of Onset  . Colon cancer Father   . Cancer Father     colon  . Diverticulitis Mother   . Heart disease Mother   . Prostate cancer Maternal Uncle   . Breast cancer      great aunt  . Diabetes Paternal Grandfather   . Colon cancer Paternal Grandmother     Social History:  reports that she has never smoked. She has never used smokeless tobacco. She reports that she drinks alcohol. She reports that she does not use illicit drugs.  REVIEW Of SYSTEMS:  Hair loss: She is asking about this problem. She  had a high testosterone from 1 lab and this was repeated and normal including the free and bioavailable testosterone from a different lab  She reportedly has fibromyalgia   Examination:   BP 134/78  Pulse 67  Temp(Src) 98.6 F (37 C)  Resp 16  Ht 5\' 9"  (1.753 m)  Wt 253 lb (114.76 kg)  BMI 37.34 kg/m2  SpO2 97%  She looks well      NECK: Left-sided nodule measures 2- 2.5 cm, smooth and firm, better felt on swallowing. No lymphadenopathy in the neck        NEUROLOGIC EXAM: Biceps reflexes show normal relaxation No ankle edema    Assessments   1. Hypothyroidism primary, long-standing, clinically doing well and has now consistently normal TSH with current dose of 112 mcg Synthroid. Compliance with the medication is excellent.   2. Benign left-sided thyroid nodule, clinically unchanged from previous exam. This is long-standing and stable  3. Hair loss: She does not appear to have an endocrine cause for this   Treatment:   Continue same dosage before breakfast daily. Avoid taking any calcium or iron supplements with the thyroid supplement.    Follow the left-sided thyroid nodule clinically  She will see me back in 6 months  Jamell Opfer 11/03/2013, 1:44 PM

## 2013-11-28 ENCOUNTER — Other Ambulatory Visit: Payer: Self-pay | Admitting: Gynecology

## 2013-11-28 DIAGNOSIS — R102 Pelvic and perineal pain: Secondary | ICD-10-CM

## 2013-11-29 LAB — CYTOLOGY - PAP

## 2013-12-15 ENCOUNTER — Ambulatory Visit (INDEPENDENT_AMBULATORY_CARE_PROVIDER_SITE_OTHER): Payer: 59 | Admitting: Internal Medicine

## 2013-12-15 ENCOUNTER — Other Ambulatory Visit (INDEPENDENT_AMBULATORY_CARE_PROVIDER_SITE_OTHER): Payer: 59

## 2013-12-15 ENCOUNTER — Encounter: Payer: Self-pay | Admitting: Internal Medicine

## 2013-12-15 VITALS — BP 138/82 | HR 64 | Temp 98.0°F | Wt 256.4 lb

## 2013-12-15 DIAGNOSIS — M79609 Pain in unspecified limb: Secondary | ICD-10-CM

## 2013-12-15 DIAGNOSIS — M79604 Pain in right leg: Secondary | ICD-10-CM

## 2013-12-15 DIAGNOSIS — R609 Edema, unspecified: Secondary | ICD-10-CM

## 2013-12-15 LAB — CK: CK TOTAL: 65 U/L (ref 7–177)

## 2013-12-15 LAB — SEDIMENTATION RATE: Sed Rate: 21 mm/hr (ref 0–22)

## 2013-12-15 NOTE — Patient Instructions (Signed)
Your next office appointment will be determined based upon review of your pending labs . Those instructions will be transmitted to you through My Chart   Please report any significant change in your symptoms.Wear over the calf support hose if you are traveling , standing for prolonged periods of  time or working on your feet for prolonged periods of time.

## 2013-12-15 NOTE — Progress Notes (Signed)
   Subjective:    Patient ID: [name] [name], female    DOB: [birth year], 63 y.o.   MRN: [id number]  HPI   She describes pain in the right calf area of the right lower extremity for several years. This is tender to touch especially if her dog jumps on her leg. The severity can be up to a level II; duration is seconds  She feels that there is intermittent swelling and "hard" areas to palpation.  She thinks these symptoms are related to her fibromyalgia. Dr. Tamala Julian performed an ultrasound which showed varicose veins. He suggested that she have a more advanced study if symptoms persisted  She does have a past history of cellulitis after fracturing the right ankle. A definitive ultrasound was performed at that time.   Her father had deep venous thrombosis following colon surgery.     Review of Systems  She describes easy bruising. CBC and differential was normal.  She denies chest pain, palpitations, dyspnea, or hemoptysis  She's had some chronic hair loss. This has been evaluated by Dr. Modena Morrow as well as Dr. Dwyane Dee. An isolated testosterone level was mildly elevated; but followup was negative  She also has history of fatty liver. Her most recent labs showed normal liver function test       Objective:   Physical Exam  Significant or distinguishing  findings on physical exam are documented first.  Below that are other systems examined & findings.    There is trace edema of the right lower extremity at the sock line  She has spider veins bilaterally. These are most prominent below the left knee laterally.  She has scattered bruises of lower extremities  Homans sign was negative bilaterally.  General appearance :good nourishment; w/o distress.  Eyes: No conjunctival inflammation or scleral icterus is present.  Oral exam: Dental hygiene is good; lips and gums are healthy appearing.There is no oropharyngeal erythema or exudate noted.   Heart:  Normal rate and regular rhythm.  S1 and S2 normal without gallop, murmur, click, rub or other extra sounds     Lungs:Chest clear to auscultation; no wheezes, rhonchi,rales ,or rubs present.No increased work of breathing.   Abdomen: bowel sounds normal, soft and non-tender without masses, organomegaly or hernias noted.  No guarding or rebound .   Musculoskeletal: Able to lie flat and sit up without help. Negative straight leg raising bilaterally. Gait normal  Skin:Warm & dry.  Intact without suspicious lesions or rashes ; no jaundice .  Lymphatic: No lymphadenopathy is noted about the head, neck, axilla.               Assessment & Plan:  #1 leg pain which is really tenderness to patient or leg trauma  #2 edema  #3 varicose veins  See orders and recommendations

## 2013-12-15 NOTE — Progress Notes (Signed)
Pre visit review using our clinic review tool, if applicable. No additional management support is needed unless otherwise documented below in the visit note. 

## 2013-12-16 LAB — D-DIMER, QUANTITATIVE (NOT AT ARMC): D-Dimer, Quant: 0.48 ug/mL-FEU (ref 0.00–0.48)

## 2013-12-27 ENCOUNTER — Encounter: Payer: Self-pay | Admitting: Internal Medicine

## 2013-12-27 ENCOUNTER — Ambulatory Visit (INDEPENDENT_AMBULATORY_CARE_PROVIDER_SITE_OTHER): Payer: 59 | Admitting: Internal Medicine

## 2013-12-27 VITALS — BP 112/78 | HR 68 | Ht 69.0 in | Wt 252.4 lb

## 2013-12-27 DIAGNOSIS — K219 Gastro-esophageal reflux disease without esophagitis: Secondary | ICD-10-CM

## 2013-12-27 DIAGNOSIS — Z800 Family history of malignant neoplasm of digestive organs: Secondary | ICD-10-CM

## 2013-12-27 DIAGNOSIS — K760 Fatty (change of) liver, not elsewhere classified: Secondary | ICD-10-CM

## 2013-12-27 DIAGNOSIS — K7689 Other specified diseases of liver: Secondary | ICD-10-CM

## 2013-12-27 DIAGNOSIS — D1803 Hemangioma of intra-abdominal structures: Secondary | ICD-10-CM

## 2013-12-27 MED ORDER — OMEPRAZOLE 20 MG PO CPDR: 20.0000 mg | DELAYED_RELEASE_CAPSULE | Freq: Every day | ORAL | Status: DC

## 2013-12-27 MED ORDER — VITAMIN E 200 UNITS PO CAPS: 800.0000 [IU] | ORAL_CAPSULE | Freq: Every day | ORAL | Status: AC

## 2013-12-27 NOTE — Patient Instructions (Signed)
We have changed your Colonoscopy recall to 12/2016.  Start over the counter Vitamin E 800 IU once capsule daily.   Decrease your omeprazole to 20 mg daily.   Discuss having a Dexa scan with your Primary Care Physician.   cc: Florina Ou, MD

## 2013-12-27 NOTE — Progress Notes (Signed)
Subjective:    Patient ID: [name] [name], female    DOB: 08-22-1950, 63 y.o.   MRN: [id number]  HPI Frederica Chrestman is a 63 year old female with a past medical history of liver hemangioma, fatty liver disease, family history of colon cancer (father and paternal grandmother), GERD, hypothyroidism, dyslipidemia, arthritis who is seen for followup. She was previously seen by Dr. Sharlett Iles in last seen in the office in April 2014. She has a history of a liver hemangioma and had an MRI scan to confirm stability. She denies liver complaint today and is feeling well. 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 She denies abdominal pain. Overall she is feeling well. She reports her heartburn is well controlled with omeprazole 40 mg daily. Her appetite has been normal without nausea or vomiting. No early satiety. No abdominal or lower extremity swelling. No itching. No jaundice. Reports normal bowel movements without blood in her stool or melena  Last colonoscopy performed 01/02/2012. There was nodularity in the rectum along with left-sided diverticulosis. No polyps or cancers. Biopsies from the rectum was normal. EGD performed on the same day revealed a 4 cm hiatal hernia, benign appearing gastric polyps, and was otherwise normal.    Review of Systems As per history of present illness, otherwise negative  Current Medications, Allergies, Past Medical History, Past Surgical History, Family History and Social History were reviewed in [employer] record.     Objective:   Physical Exam BP 112/78  Pulse 68  Ht 5\' 9"  (1.753 m)  Wt 252 lb 6.4 oz (114.488 kg)  BMI 37.26 kg/m2 Constitutional: Well-developed and well-nourished. No distress. HEENT: Normocephalic and atraumatic. Oropharynx is clear and moist. No oropharyngeal exudate. Conjunctivae are normal.  No scleral icterus. Neck: Neck supple. Trachea  midline. Cardiovascular: Normal rate, regular rhythm and intact distal pulses. No M/R/G Pulmonary/chest: Effort normal and breath sounds normal. No wheezing, rales or rhonchi. Abdominal: Soft, nontender, nondistended. Bowel sounds active throughout. There are no masses palpable. No hepatosplenomegaly. Extremities: no clubbing, cyanosis, or edema Lymphadenopathy: No cervical adenopathy noted. Neurological: Alert and oriented to person place and time. Skin: Skin is warm and dry. No rashes noted. Psychiatric: Normal mood and affect. Behavior is normal.  CBC    Component Value Date/Time   HGB 14.4 02/27/2012 1133    CMP     Component Value Date/Time   CREATININE 0.78 09/10/2012 1626   PROT 6.9 11/02/2013 1406   ALBUMIN 4.0 11/02/2013 1406   AST 28 11/02/2013 1406   ALT 26 11/02/2013 1406   ALKPHOS 79 11/02/2013 1406   BILITOT 0.9 11/02/2013 1406   GFRNONAA 88* 09/10/2012 1626   GFRAA >90 09/10/2012 1626    Iron/TIBC/Ferritin/ %Sat    Component Value Date/Time   IRON 52 09/08/2012 0740   FERRITIN 58.6 09/08/2012 0740   IRONPCTSAT 17.2* 09/08/2012 0740   MRI ABDOMEN WITHOUT AND WITH CONTRAST -- April 2014   Technique:  Multiplanar multisequence MR imaging of the abdomen was performed both before and after the administration of intravenous contrast.   Contrast: 60mL MULTIHANCE GADOBENATE DIMEGLUMINE 529 MG/ML IV SOLN   Comparison:  CT abdomen 10/06/2007   Findings:  There is a well-circumscribed lesion within the right hepatic lobe which is hyperintense on T2-weighted imaging measuring 34 x 24 mm (image 18, series 3) which is not changed from 33x 23 mm on prior.  This lesion demonstrates peripheral enhancement with some central  filling most consistent with benign hemangioma.   There is interval atrophy of the right lateral hepatic lobe (segments VI and VII) compared to prior.  There is a concomitant hypertrophy of the left lateral hepatic lobe (segments II and segments III).   There is  mild loss of signal intensity on opposed phase imaging consistent with hepatic steatosis.  There are no additional enhancing lesions within the liver.   The gallbladder is normal.  No biliary ductal dilatation.  The pancreas, spleen, adrenal glands, and kidneys are normal.  The stomach is normal.  There is a diverticulum in the second portion the duodenum measuring 3 cm (image 27, series 28.   IMPRESSION:   1.  A benign hemangioma within the right hepatic lobe is not changed in size compared to prior. 2.  Interval atrophy of the right lateral hepatic lobe and hypertrophy of the left lateral hepatic lobe. 3.  Mild hepatic steatosis. 4.  Duodenal diverticulum.      Assessment & Plan:  63 year old female with a past medical history of liver hemangioma, fatty liver disease, family history of colon cancer (father and paternal grandmother), GERD, hypothyroidism, dyslipidemia, arthritis who is seen for followup  1.  Fatty liver -- patient does have fatty liver disease by imaging but no evidence for chronic liver disease or cirrhosis. Her liver enzymes have also been normal over the past year. We discussed fatty liver disease in general and I have recommended that she work to maintain a healthy diet and try to exercise more. Weight reduction would likely improve overall fatty liver disease burden. I recommended vitamin E 800 international units daily. Liver enzymes should be monitored every 6-12 months and can be done by primary care.  2.  Liver hemangioma -- benign and stable, no further imaging necessary at present  3.  GERD -- stable and well controlled. She would like to reduce medication burden if possible. We will decrease omeprazole to 20 mg once daily and if this is well tolerated she can try reducing to every other day. I recommended that she stay current on her DEXA scans through primary care.  4.  Family history of colon cancer -- surveillance colonoscopy recommended 5 years from  previous which would be July 2018  Return in 1 yr

## 2013-12-28 ENCOUNTER — Ambulatory Visit
Admission: RE | Admit: 2013-12-28 | Discharge: 2013-12-28 | Disposition: A | Discharge status: Home / Self Care | Payer: 59 | Source: Ambulatory Visit | Attending: Gynecology | Admitting: Gynecology

## 2013-12-28 DIAGNOSIS — R102 Pelvic and perineal pain: Secondary | ICD-10-CM

## 2014-01-06 ENCOUNTER — Ambulatory Visit (INDEPENDENT_AMBULATORY_CARE_PROVIDER_SITE_OTHER): Payer: 59 | Admitting: Family Medicine

## 2014-01-06 VITALS — BP 140/80 | HR 77 | Ht 69.0 in | Wt 250.0 lb

## 2014-01-06 DIAGNOSIS — M545 Low back pain, unspecified: Secondary | ICD-10-CM

## 2014-01-06 DIAGNOSIS — M25579 Pain in unspecified ankle and joints of unspecified foot: Secondary | ICD-10-CM

## 2014-01-06 DIAGNOSIS — M25539 Pain in unspecified wrist: Secondary | ICD-10-CM

## 2014-01-06 DIAGNOSIS — M25532 Pain in left wrist: Secondary | ICD-10-CM

## 2014-01-06 DIAGNOSIS — M25572 Pain in left ankle and joints of left foot: Secondary | ICD-10-CM

## 2014-01-06 DIAGNOSIS — M999 Biomechanical lesion, unspecified: Secondary | ICD-10-CM

## 2014-01-06 NOTE — Patient Instructions (Signed)
You are doing well.  Sacroiliac Joint Mobilization and Rehab 1. Work on pretzel stretching, shoulder back and leg draped in front. 3-5 sets, 30 sec.. 2. hip abductor rotations. standing, hip flexion and rotation outward then inward. 3 sets, 15 reps. when can do comfortably, add ankle weights starting at 2 pounds.  3. cross over stretching - shoulder back to ground, same side leg crossover. 3-5 sets for 30 min..  4. rolling up and back knees to chest and rocking. 5. sacral tilt - 5 sets, hold for 5-10 seconds Ice is your friend Continue all the other medicines.  Lets say 5 weeks.

## 2014-01-07 ENCOUNTER — Encounter: Payer: Self-pay | Admitting: Family Medicine
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
## 2014-01-07 NOTE — Assessment & Plan Note (Signed)
Patient continues to respond very well to osteopathic manipulation. Patient was given some other range of motion exercises will she is moving and lifting boxes. We did discuss also lifting position that can be beneficial. We discussed icing regimen I Tannenbaum days that could be helpful. Patient will continue the home exercises she's been doing previously as well as the nutritional supplementation. Patient and will come back and see me again in 4-6 weeks.  Regarding patient's wrist and ankle pain she is asymptomatic today on physical exam but we will continue to monitor. Patient knows if any worsening pain in the next couple weeks occur she should come back for further evaluation.

## 2014-01-07 NOTE — Assessment & Plan Note (Signed)
Decision today to treat with OMT was based on Physical Exam  After verbal consent patient was treated with HVLA ME techniques in cervical, thoracic, lumbar and sacral areas  Patient tolerated the procedure well with improvement in symptoms  Patient given exercises, stretches and lifestyle modifications  See medications in patient instructions if given  Patient will follow up in 4-6 weeks

## 2014-01-23 ENCOUNTER — Encounter: Payer: Self-pay | Admitting: Internal Medicine

## 2014-01-23 ENCOUNTER — Ambulatory Visit (INDEPENDENT_AMBULATORY_CARE_PROVIDER_SITE_OTHER): Payer: 59 | Admitting: Internal Medicine

## 2014-01-23 VITALS — BP 130/80 | HR 82 | Temp 98.2°F | Resp 16 | Wt 247.0 lb

## 2014-01-23 DIAGNOSIS — H1130 Conjunctival hemorrhage, unspecified eye: Secondary | ICD-10-CM

## 2014-01-23 DIAGNOSIS — H1132 Conjunctival hemorrhage, left eye: Secondary | ICD-10-CM

## 2014-01-23 MED ORDER — TOBRAMYCIN 0.3 % OP OINT: 1.0000 "application " | TOPICAL_OINTMENT | Freq: Three times a day (TID) | OPHTHALMIC | Status: DC

## 2014-01-23 NOTE — Progress Notes (Signed)
Pre visit review using our clinic review tool, if applicable. No additional management support is needed unless otherwise documented below in the visit note. 

## 2014-01-23 NOTE — Patient Instructions (Signed)
Subconjunctival Hemorrhage °A subconjunctival hemorrhage is a bright red patch covering a portion of the white of the eye. The white part of the eye is called the sclera, and it is covered by a thin membrane called the conjunctiva. This membrane is clear, except for tiny blood vessels that you can see with the naked eye. When your eye is irritated or inflamed and becomes red, it is because the vessels in the conjunctiva are swollen. °Sometimes, a blood vessel in the conjunctiva can break and bleed. When this occurs, the blood builds up between the conjunctiva and the sclera, and spreads out to create a red area. The red spot may be very small at first. It may then spread to cover a larger part of the surface of the eye, or even all of the visible white part of the eye. °In almost all cases, the blood will go away and the eye will become white again. Before completely dissolving, however, the red area may spread. It may also become brownish-yellow in color before going away. If a lot of blood collects under the conjunctiva, it may look like a bulge on the surface of the eye. This looks scary, but it will also eventually flatten out and go away. Subconjunctival hemorrhages do not cause pain, but if swollen, may cause a feeling of irritation. There is no effect on vision.  °CAUSES  °· The most common cause is mild trauma (rubbing the eye, irritation). °· Subconjunctival hemorrhages can happen because of coughing or straining (lifting heavy objects), vomiting, or sneezing. °· In some cases, your doctor may want to check your blood pressure. High blood pressure can also cause a subconjunctival hemorrhage. °· Severe trauma or blunt injuries. °· Diseases that affect blood clotting (hemophilia, leukemia). °· Abnormalities of blood vessels behind the eye (carotid cavernous sinus fistula). °· Tumors behind the eye. °· Certain drugs (aspirin, Coumadin, heparin). °· Recent eye surgery. °HOME CARE INSTRUCTIONS  °· Do not worry  about the appearance of your eye. You may continue your usual activities. °· Often, follow-up is not necessary. °SEEK MEDICAL CARE IF:  °· Your eye becomes painful. °· The bleeding does not disappear within 3 weeks. °· Bleeding occurs elsewhere, for example, under the skin, in the mouth, or in the other eye. °· You have recurring subconjunctival hemorrhages. °SEEK IMMEDIATE MEDICAL CARE IF:  °· Your vision changes or you have difficulty seeing. °· You develop a severe headache, persistent vomiting, confusion, or abnormal drowsiness (lethargy). °· Your eye seems to bulge or protrude from the eye socket. °· You notice the sudden appearance of bruises or have spontaneous bleeding elsewhere on your body. °Document Released: 05/26/2005 Document Revised: 10/10/2013 Document Reviewed: 04/23/2009 °Exit[employer]® Patient Information ©2015 [employer]. This information is not intended to replace advice given to you by your health care provider. Make sure you discuss any questions you have with your health care provider. ° °

## 2014-01-24 ENCOUNTER — Encounter: Payer: Self-pay | Admitting: Internal Medicine

## 2014-01-24 ENCOUNTER — Telehealth: Payer: Self-pay

## 2014-01-24 NOTE — Progress Notes (Signed)
   Subjective:    Patient ID: [name] [name], female    DOB: Jan 08, 1951, 63 y.o.   MRN: [id number]  HPI Comments: She stepped on a plastic downspout of a gutter yesterday and a piece flew up and hit her in the left eye. There is a red area and some aching but she has not had any photophobia.   Eye Injury  The left eye is affected.This is a new problem. The current episode started yesterday. The injury mechanism was a foreign body. The pain is at a severity of 1/10. There is no known exposure to pink eye. She does not wear contacts. Associated symptoms include eye redness. Pertinent negatives include no blurred vision, eye discharge, double vision, fever, foreign body sensation, itching, nausea, photophobia, recent URI or vomiting. She has tried nothing for the symptoms.      Review of Systems  Constitutional: Negative for fever.  Eyes: Positive for redness. Negative for blurred vision, double vision, photophobia and discharge.  Gastrointestinal: Negative for nausea and vomiting.  Skin: Negative for itching.  All other systems reviewed and are negative.      Objective:   Physical Exam  Vitals reviewed. Constitutional: She is oriented to person, place, and time. She appears well-developed and well-nourished.  Non-toxic appearance. She does not have a sickly appearance. She does not appear ill. No distress.  HENT:  Head: Normocephalic and atraumatic.  Mouth/Throat: Oropharynx is clear and moist. No oropharyngeal exudate.  Eyes: EOM and lids are normal. Pupils are equal, round, and reactive to light. Right eye exhibits no chemosis, no discharge, no exudate and no hordeolum. No foreign body present in the right eye. Left eye exhibits no chemosis, no discharge, no exudate and no hordeolum. No foreign body present in the left eye. Right conjunctiva is not injected. Right conjunctiva has no hemorrhage. Left conjunctiva is not injected. Left conjunctiva has a hemorrhage. No scleral icterus. Right  eye exhibits normal extraocular motion and no nystagmus. Left eye exhibits normal extraocular motion and no nystagmus. Right pupil is round and reactive. Left pupil is round and reactive. Pupils are equal.    There is no hyphema, abrasion, or FB  Neck: Normal range of motion. Neck supple. No JVD present. No tracheal deviation present. No thyromegaly present.  Cardiovascular: Normal rate, regular rhythm, normal heart sounds and intact distal pulses.  Exam reveals no gallop and no friction rub.   No murmur heard. Pulmonary/Chest: Effort normal and breath sounds normal. No stridor. No respiratory distress. She has no wheezes. She has no rales. She exhibits no tenderness.  Abdominal: Soft. Bowel sounds are normal. She exhibits no distension and no mass. There is no tenderness. There is no rebound and no guarding.  Musculoskeletal: Normal range of motion. She exhibits no edema and no tenderness.  Lymphadenopathy:    She has no cervical adenopathy.  Neurological: She is oriented to person, place, and time.  Skin: Skin is warm and dry. No rash noted. She is not diaphoretic. No erythema. No pallor.          Assessment & Plan:

## 2014-01-24 NOTE — Telephone Encounter (Signed)
Pharmacy  States tobrex 0.3% ointment co-pay is $75.  Pt would like changed to something less expensive, please advise

## 2014-01-24 NOTE — Telephone Encounter (Signed)
   She needs to discuss  options with her pharmacist to determine best ,cheaper antibiotic ointment available. She is allergic to erythromycin

## 2014-01-24 NOTE — Assessment & Plan Note (Signed)
Reassurance offered Will treat with tobramycin ophth ointment to prevent infection She was given pt ed material

## 2014-01-24 NOTE — Telephone Encounter (Signed)
Pharmacy called back requesting ointment to be change. MD is out of office until Friday. Pls advsie...[name] [name]

## 2014-01-25 NOTE — Telephone Encounter (Signed)
Called pharmacy spoke with [facility] the other alternative would be Bactrim-poly. Will change to that ointment...[name] [name]

## 2014-02-21 ENCOUNTER — Telehealth: Payer: Self-pay | Admitting: Internal Medicine

## 2014-02-21 NOTE — Telephone Encounter (Signed)
Patient can not get in with her regular provider.  She is having chest discomfort and would like to come in this Thursday to see you if ok.  Patient also wants to talk to you about last lab work Dr. Linna Darner ordered.  Please advise.

## 2014-02-21 NOTE — Telephone Encounter (Signed)
Notified pt. 

## 2014-02-21 NOTE — Telephone Encounter (Signed)
OK 

## 2014-02-23 ENCOUNTER — Ambulatory Visit (INDEPENDENT_AMBULATORY_CARE_PROVIDER_SITE_OTHER): Payer: 59 | Admitting: Internal Medicine

## 2014-02-23 ENCOUNTER — Encounter: Payer: Self-pay | Admitting: Internal Medicine

## 2014-02-23 VITALS — BP 138/84 | HR 69 | Temp 98.1°F | Wt 247.1 lb

## 2014-02-23 DIAGNOSIS — Z23 Encounter for immunization: Secondary | ICD-10-CM

## 2014-02-23 DIAGNOSIS — R0789 Other chest pain: Secondary | ICD-10-CM

## 2014-02-23 MED ORDER — BUSPIRONE HCL 15 MG PO TABS: ORAL_TABLET | ORAL | Status: DC

## 2014-02-23 NOTE — Progress Notes (Signed)
   Subjective:    Patient ID: [name] [name], female    DOB: [birth year], 63 y.o.   MRN: [id number]  HPI   She describes intermittent chest pain for years. The most recent exacerbation was 02/20/14 in the setting of stress. She had been experiencing some malaise prior to onset of the discomfort. The pain was at the left sternal border and described as a dull twinge. There is no radiation. There is no associated nausea or sweating. The pain can last minutes up to 60 minutes.  Last episode was 02/20/14.  There are no specific triggers except for stress  She's been walking 1.5 miles every 2-3 days with no cardiac pulmonary symptoms  Resting, deep breathing, and aspirin seem to help.  She does find that if she will take a nap; upon awakening and standing she may have some lightheadedness and dizziness.  In 2007 she was placed on Buspirone for the chest pain with good response. She continued this for several years  Her mother had hypertension and congestive heart failure @ 75. Maternal grandmother had stroke at 31.   Review of Systems  She denies dyspepsia; she is on PPI. She has no dysphagia, abdominal pain, unexplained weight loss, melena, rectal bleeding. She's had a minor cough. She has no sputum production or hemoptysis.  There is no associated back pain with anterior radiation.  He does not have claudication, edema, paroxysmal nocturnal dyspnea  She has had no significant dizziness or syncope.  There is no change in color or temperature of the skin in the area of the discomfort.       Objective:   Physical Exam   Positive pertinent findings include: Abdomen protuberant without organomegaly or masses. Dorsalis pedis pulses are slightly decreased. Homans sign is negative.  Appears healthy and well-nourished & in no acute distress No carotid bruits are present.No neck pain distention present at 10 - 15 degrees. Thyroid normal to palpation Heart rhythm and rate are normal with  no gallop or murmur Chest is clear with no increased work of breathing There is no evidence of aortic aneurysm or renal artery bruits Abdomen soft with no organomegaly or masses. No HJR No clubbing, cyanosis or edema present. Pedal pulses are intact & equal No ischemic skin changes are present . Fingernails/ toenails healthy  Alert and oriented. Strength, tone, DTRs reflexes normal        Assessment & Plan:   #1 atypical chest pain, recurrent.  EKG reveals short PR interval. It also exhibits nonspecific T changes with low voltage in two V leads. An order for troponin will be placed to expire in 3 months. She should come and have this test performed if she has recurrent discomfort  #2 exogenous stress Prn buspirone will be reinitiated.

## 2014-02-23 NOTE — Progress Notes (Signed)
Pre visit review using our clinic review tool, if applicable. No additional management support is needed unless otherwise documented below in the visit note. 

## 2014-02-23 NOTE — Patient Instructions (Signed)
Order for blood tests entered into  the computer; these should be done if chest pain recurs. No appointment is necessary.

## 2014-03-21 ENCOUNTER — Other Ambulatory Visit (INDEPENDENT_AMBULATORY_CARE_PROVIDER_SITE_OTHER): Payer: 59

## 2014-03-21 ENCOUNTER — Ambulatory Visit (INDEPENDENT_AMBULATORY_CARE_PROVIDER_SITE_OTHER): Payer: 59 | Admitting: Family Medicine

## 2014-03-21 ENCOUNTER — Encounter: Payer: Self-pay | Admitting: Family Medicine

## 2014-03-21 VITALS — BP 128/84 | HR 84 | Ht 69.0 in

## 2014-03-21 DIAGNOSIS — S8002XA Contusion of left knee, initial encounter: Secondary | ICD-10-CM

## 2014-03-21 DIAGNOSIS — M25562 Pain in left knee: Secondary | ICD-10-CM

## 2014-03-21 DIAGNOSIS — S8000XA Contusion of unspecified knee, initial encounter: Secondary | ICD-10-CM | POA: Insufficient documentation

## 2014-03-21 NOTE — Progress Notes (Signed)
[name] [address] [place], [place] [address] Phone: [phone] Subjective:     CC: Knee injury  [id number] [name] [name] is a 63 y.o. female coming in with complaint of knee pain after following going up steps. Patient fell directly onto her left knee. Patient did have significant bruising and some swelling initially of the anterior knee and anterior shin. Patient states that that has improved over the course of the last week. Patient states that this continues to have a sensitivity when she directly hits on the anterior aspect of the knee. Patient is more towards the tibial tuberosity. Patient states he to be very sore initially and then very mild dull aching pain for approximately minutes. Patient is able to do daily activities and is able to rest at night. Patient has not taken any medications for this at this time. Patient denies any radiation down the leg or any numbness or weakness. Patient does have a past medical history significant for a plica removal in the arthroscopic surgery in 1980 on this knee.     Past medical history, social, surgical and family history all reviewed in electronic medical record.   Review of Systems: No headache, visual changes, nausea, vomiting, diarrhea, constipation, dizziness, abdominal pain, skin rash, fevers, chills, night sweats, weight loss, swollen lymph nodes, body aches, joint swelling, muscle aches, chest pain, shortness of breath, mood changes.   Objective Blood pressure 128/84, pulse 84, height 5\' 9"  (1.753 m), SpO2 95.00%.  General: No apparent distress alert and oriented x3 mood and affect normal, dressed appropriately.  HEENT: Pupils equal, extraocular movements intact  Respiratory: Patient's speak in full sentences and does not appear short of breath  Cardiovascular: No lower extremity edema, non tender, no erythema  Skin: Warm dry intact with no signs of infection or rash on extremities or on axial  skeleton.  Abdomen: Soft nontender  Neuro: Cranial nerves II through XII are intact, neurovascularly intact in all extremities with 2+ DTRs and 2+ pulses.  Lymph: No lymphadenopathy of posterior or anterior cervical chain or axillae bilaterally.  Gait normal with good balance and coordination.  MSK:  Non tender with full range of motion and good stability and symmetric strength and tone of shoulders, elbows, wrist, hip, and ankles bilaterally.  Knee: Left knee On inspection patient's knee does have resolving swelling and bruising on the anterior tibial region. Patient is minimally tender over the anterior tibialis area. Mostly at the insertion of the patellar tendon. Extensor mechanism is intact ROM full in flexion and extension and lower leg rotation. Ligaments with solid consistent endpoints including ACL, PCL, LCL, MCL. Negative Mcmurray's, Apley's, and Thessalonian tests. Non painful patellar compression. Patellar glide without crepitus. Patellar and quadriceps tendons unremarkable. Hamstring and quadriceps strength is normal.    MSK US performed of: Left knee This study was ordered, performed, and interpreted by Charlann Boxer D.O.  Knee: All structures visualized. Anteromedial meniscus has been removed, anterolateral, posteromedial, and posterolateral menisci unremarkable without tearing, fraying, effusion, or displacement. Patellar Tendon unremarkable on long and transverse views without effusion. Patient does have what appears to be a bone contusion with increasing Doppler flow just inferior to the insertion of the patellar tendon on the tibia area. No significant cortical defect noted. No abnormality of prepatellar bursa. LCL and MCL unremarkable on long and transverse views. No abnormality of origin of medial or lateral head of the gastrocnemius.  IMPRESSION:  Bone contusion of the tibia.  Impression and Recommendations:     This case required medical decision making of  moderate complexity.

## 2014-03-21 NOTE — Assessment & Plan Note (Signed)
Patient contusion is mostly over the anterior aspect of the tibia. We discussed an icing protocol, and avoiding direct contact, topical anti-inflammatories for pain relief. Discuss that if no better in 2 weeks patient will come back for further evaluation and x-rays will be taken.

## 2014-03-21 NOTE — Patient Instructions (Addendum)
It is great to see you! Ice 20 minutes 2 times daily.  Pennsaid twice daily for pain relief.  No direct contact for another 2 weeks.   Will take 2 weeks to heal  Come back when you need me.

## 2014-05-10 ENCOUNTER — Ambulatory Visit: Payer: 59 | Admitting: Endocrinology

## 2014-05-18 ENCOUNTER — Ambulatory Visit: Payer: 59 | Admitting: Endocrinology

## 2014-05-29 ENCOUNTER — Ambulatory Visit: Payer: 59 | Admitting: Endocrinology

## 2014-05-30 ENCOUNTER — Other Ambulatory Visit (INDEPENDENT_AMBULATORY_CARE_PROVIDER_SITE_OTHER): Payer: 59

## 2014-05-30 DIAGNOSIS — E039 Hypothyroidism, unspecified: Secondary | ICD-10-CM

## 2014-05-30 LAB — TSH: TSH: 3.04 u[IU]/mL (ref 0.35–4.50)

## 2014-05-30 LAB — T4, FREE: FREE T4: 0.99 ng/dL (ref 0.60–1.60)

## 2014-06-12 ENCOUNTER — Other Ambulatory Visit (INDEPENDENT_AMBULATORY_CARE_PROVIDER_SITE_OTHER): Payer: 59

## 2014-06-12 ENCOUNTER — Ambulatory Visit (INDEPENDENT_AMBULATORY_CARE_PROVIDER_SITE_OTHER)
Admission: RE | Admit: 2014-06-12 | Discharge: 2014-06-12 | Disposition: A | Discharge status: Home / Self Care | Payer: 59 | Source: Ambulatory Visit | Attending: Family Medicine | Admitting: Family Medicine

## 2014-06-12 ENCOUNTER — Ambulatory Visit (INDEPENDENT_AMBULATORY_CARE_PROVIDER_SITE_OTHER): Payer: 59 | Admitting: Family Medicine

## 2014-06-12 ENCOUNTER — Other Ambulatory Visit: Payer: Self-pay | Admitting: *Deleted

## 2014-06-12 ENCOUNTER — Encounter: Payer: Self-pay | Admitting: Family Medicine

## 2014-06-12 VITALS — BP 146/82 | HR 76 | Ht 69.0 in

## 2014-06-12 DIAGNOSIS — S83241A Other tear of medial meniscus, current injury, right knee, initial encounter: Secondary | ICD-10-CM

## 2014-06-12 DIAGNOSIS — M25561 Pain in right knee: Secondary | ICD-10-CM

## 2014-06-12 MED ORDER — MELOXICAM 15 MG PO TABS: 15.0000 mg | ORAL_TABLET | Freq: Every day | ORAL | Status: DC

## 2014-06-12 NOTE — Progress Notes (Signed)
[name] [place], [place] [address] Phone: [phone] Subjective:     CC: Right knee pain.   [id number] [name] [name] is a 64 y.o. female coming in with complaint of right knee pain. Patient was seen previously and had more of a tibia contusion on the left knee 3 months ago. Patient states on Friday she slipped coming out of the shower. Patient was doing the splits and cod herself. Patient had pain as well as swelling within the first hour. Patient states most of her pain seemed to be localized on the medial aspect of the knee as well as somewhat posterior. Patient states that it became difficult to bear weight. Patient states it is very stiff. Swelling has gone down over the course of the several days. Patient states though knee does not feel normal. She has tried icing as well as Aleve with minimal benefit. Patient is ambulating with the aid of a crutches. Denies nighttime awakening. Knee does not feel stable.     Past medical history, social, surgical and family history all reviewed in electronic medical record.   Review of Systems: No headache, visual changes, nausea, vomiting, diarrhea, constipation, dizziness, abdominal pain, skin rash, fevers, chills, night sweats, weight loss, swollen lymph nodes, body aches, joint swelling, muscle aches, chest pain, shortness of breath, mood changes.   Objective Blood pressure 146/82, pulse 76, height 5\' 9"  (1.753 m), SpO2 95 %.  General: No apparent distress alert and oriented x3 mood and affect normal, dressed appropriately.  HEENT: Pupils equal, extraocular movements intact  Respiratory: Patient's speak in full sentences and does not appear short of breath  Cardiovascular: No lower extremity edema, non tender, no erythema  Skin: Warm dry intact with no signs of infection or rash on extremities or on axial skeleton.  Abdomen: Soft nontender  Neuro: Cranial nerves II through XII are intact,  neurovascularly intact in all extremities with 2+ DTRs and 2+ pulses.  Lymph: No lymphadenopathy of posterior or anterior cervical chain or axillae bilaterally.  Gait ambulating carefully with crutches MSK:  Non tender with full range of motion and good stability and symmetric strength and tone of shoulders, elbows, wrist, hip, and ankles bilaterally.  Knee: Right Normal to inspection with no erythema or effusion or obvious bony abnormalities. Severely tender to palpation over the medial joint line ROM full in flexion and extension and lower leg rotation. Mild pain at full extension and full flexion. Ligaments with solid consistent endpoints including patient does have gapping of the MCL compared to the contralateral side. Patient also has some mild laxity of the anterior cruciate ligament but endpoint is noted. Positive Mcmurray's, Apley's, and Thessalonian tests. painful patellar compression. Patellar glide without crepitus. Patellar and quadriceps tendons unremarkable. Hamstring and quadriceps strength is normal with mild discomfort at the insertion of the lateral hamstring.. Contralateral knee unremarkable  MSK US performed of: Right knee This study was ordered, performed, and interpreted by Charlann Boxer D.O.  Knee: All structures visualized. Anteromedial meniscus does have a large displaced vertical tear noted. Hypoechoic changes as well as increasing Doppler flow noted. No bone deformity noted. Patient does have mild to moderate osteophytic changes. anterolateral, posteromedial, and posterolateral menisci unremarkable without tearing, fraying, effusion, or displacement. Patellar Tendon unremarkable on long and transverse views without effusion. No abnormality of prepatellar bursa. MCL does have what appears to be of tear proximal 0.50% on the articular surface. There is no significant retraction. Hypoechoic changes and increasing  Doppler flow noted. unremarkable on long and transverse  views. No abnormality of origin of medial or lateral head of the gastrocnemius.  IMPRESSION:  Acute meniscal tear medial displaced with MCL injury.        Impression and Recommendations:     This case required medical decision making of moderate complexity.

## 2014-06-12 NOTE — Patient Instructions (Signed)
Good to see you Ice 20 minutes 2 times daily. Usually after activity and before bed. Exercises 3 times a week.  Wear the brace daily for next 10 days.  Do not wear brace at night.  Xray downstairs when you can.  Meloxicam daily for 10 days.  See me again in 10-14 days.

## 2014-06-12 NOTE — Assessment & Plan Note (Signed)
Seen to day, does have MCL and likely ACL sprain but no true tear. Knee immobilizer and meloxicam 10 days Discussed icing and OTC medications.   Will come back in 2 weeks for further evaluation.  Xrays ordered today.

## 2014-06-22 ENCOUNTER — Encounter: Payer: Self-pay | Admitting: Endocrinology

## 2014-06-22 ENCOUNTER — Ambulatory Visit (INDEPENDENT_AMBULATORY_CARE_PROVIDER_SITE_OTHER): Payer: 59 | Admitting: Endocrinology

## 2014-06-22 VITALS — BP 118/72 | HR 79 | Temp 98.1°F | Resp 14 | Ht 69.0 in | Wt 247.8 lb

## 2014-06-22 DIAGNOSIS — E041 Nontoxic single thyroid nodule: Secondary | ICD-10-CM

## 2014-06-22 DIAGNOSIS — E039 Hypothyroidism, unspecified: Secondary | ICD-10-CM

## 2014-06-22 MED ORDER — SYNTHROID 112 MCG PO TABS: 112.0000 ug | ORAL_TABLET | Freq: Every day | ORAL | Status: DC

## 2014-06-22 NOTE — Progress Notes (Signed)
[name] [name] 64 y.o.   Reason for Appointment:  Hypothyroidism, followup visit   History of Present Illness:   The hypothyroidism was first diagnosed in 1969 with symptoms of fatigue, depression and weight gain Complaints are reported by the patient now are none following: Unusual fatigue, unexpected weight gain, cold sensitivity, difficulty concentrating , dry skin, hair loss.           The treatments that the patient has taken include Synthroid brand name.   In 07/2012 her TSH was over 5 and the dose was increased to 112 mcg.  TSH was normal subsequently  and she has not required any changes in her dose  Compliance with the medical regimen has been as prescribed with taking the tablet in the morning before breakfast. She does take her calcium with her breakfast, previously was taking it later in the day and takes her thyroid supplement just before eating breakfast. However her TSH is fairly stable  Lab Results  Component Value Date   FREET4 0.99 05/30/2014   FREET4 0.94 11/02/2013   FREET4 1.05 04/20/2013   TSH 3.04 05/30/2014   TSH 3.72 11/02/2013   TSH 2.41 04/20/2013    Problem 2: THYROID nodule: This is long-standing since about 2005 when the needle aspiration showed colloid nodule with benign follicular cells.  She does not feel any local pressure sensation or choking     Medication List       This list is accurate as of: 06/22/14  3:26 PM.  Always use your most recent med list.               aspirin 81 MG tablet  Take 81 mg by mouth daily.     busPIRone 15 MG tablet  Commonly known as:  BUSPAR  1bid prn     CALCIUM 500+D 500-200 MG-UNIT per tablet  Generic drug:  calcium-vitamin D  Take 1 tablet by mouth daily.     cetirizine 10 MG tablet  Commonly known as:  ZYRTEC  Take 10 mg by mouth as needed.     clobetasol ointment 0.05 %  Commonly known as:  TEMOVATE  Apply topically as needed.     FLEXERIL 10 MG tablet  Generic drug:  cyclobenzaprine   Take 10 mg by mouth as needed.     folic acid 1 MG tablet  Commonly known as:  FOLVITE  Take 1 mg by mouth daily.     levothyroxine 112 MCG tablet  Commonly known as:  SYNTHROID, LEVOTHROID  Take 1 tablet (112 mcg total) by mouth daily.     meloxicam 15 MG tablet  Commonly known as:  MOBIC  Take 1 tablet (15 mg total) by mouth daily.     multivitamin capsule  Take 1 capsule by mouth daily.     omeprazole 20 MG capsule  Commonly known as:  PRILOSEC  Take 1 capsule (20 mg total) by mouth daily.     POLY BACITRACIN 500-10000 UNIT/GM Oint  Apply topically 3 (three) times daily.     STELARA 90 MG/ML Sosy  Generic drug:  Ustekinumab  Inject 90 mcg into the skin. Every three months     tobramycin 0.3 % ophthalmic ointment  Commonly known as:  TOBREX  Place 1 application into the left eye 3 (three) times daily.     VALTREX PO  Take 1 g by mouth as needed. Take 1 gram daily     VECTICAL 3 MCG/GM cream  Generic drug:  Calcitriol  Apply topically at bedtime.     vitamin E 200 UNIT capsule  Commonly known as:  vitamin E  Take 4 capsules (800 Units total) by mouth daily.     VOLTAREN 1 % Gel  Generic drug:  diclofenac sodium  Apply 1 application topically as needed.        Allergies:  Allergies  Allergen Reactions  . Erythromycin   . Nabumetone     Past Medical History  Diagnosis Date  . Hypothyroidism   . Osteoporosis   . Rosacea   . Granuloma annulare   . Psoriasis   . Dyslipidemia   . Multinodular goiter   . Fibromyalgia   . Anxiety   . Diverticulosis of colon (without mention of hemorrhage)   . Family history of malignant neoplasm of gastrointestinal tract   . Snores   . GERD (gastroesophageal reflux disease)   . Arthritis   . Diverticulosis   . Hiatal hernia     Past Surgical History  Procedure Laterality Date  . Tubal ligation    . Knee surgery      left  . Tonsillectomy    . Upper gastrointestinal endoscopy  2013    esoph dilation  .  Colonoscopy  2013  . Mass excision  02/27/2012    Procedure: EXCISION MASS;  Surgeon: Schuyler Amor, MD;  Location: [facility];  Service: Orthopedics;  Laterality: Left;  excision of left forearm/wrist mass  . Wrist surgery      Family History  Problem Relation Age of Onset  . Colon cancer Father   . Cancer Father     colon  . Diverticulitis Mother   . Heart disease Mother   . Prostate cancer Maternal Uncle   . Breast cancer      great aunt  . Diabetes Paternal Grandfather   . Colon cancer Paternal Grandmother     Social History:  reports that she has never smoked. She has never used smokeless tobacco. She reports that she does not drink alcohol or use illicit drugs.  REVIEW Of SYSTEMS:  Hair loss: She is not having any significant problem now and has only mild hair loss    Examination:   BP 118/72 mmHg  Pulse 79  Temp(Src) 98.1 F (36.7 C)  Resp 14  Ht 5\' 9"  (1.753 m)  Wt 247 lb 12.8 oz (112.401 kg)  BMI 36.58 kg/m2  SpO2 96%  She looks well      NECK: Left-sided nodule palpable medially, about 2- 2.5 cm, smooth and firm, better felt on swallowing. No lymphadenopathy in the neck        NEUROLOGIC EXAM: Biceps reflexes show normal relaxation No lower leg edema    Assessments/plan   1. Hypothyroidism primary, long-standing, clinically doing well and has now consistently normal TSH with current dose of 112 mcg Synthroid. Compliance with the medication is excellent.  However advised her to take her calcium separately  2. Benign left-sided thyroid nodule, clinically unchanged from previous exam. This is long-standing and stable No need for any further ultrasounds, has been shown to be benign previously  She will see me back in 6 months  [name] [name] 06/22/2014, 3:26 PM

## 2014-06-22 NOTE — Patient Instructions (Signed)
Take calcium at lunch 

## 2014-06-23 ENCOUNTER — Ambulatory Visit (INDEPENDENT_AMBULATORY_CARE_PROVIDER_SITE_OTHER): Payer: 59 | Admitting: Family Medicine

## 2014-06-23 ENCOUNTER — Other Ambulatory Visit (INDEPENDENT_AMBULATORY_CARE_PROVIDER_SITE_OTHER): Payer: 59

## 2014-06-23 ENCOUNTER — Encounter: Payer: Self-pay | Admitting: Family Medicine

## 2014-06-23 VITALS — BP 132/78 | HR 81 | Ht 69.0 in | Wt 247.0 lb

## 2014-06-23 DIAGNOSIS — S83241D Other tear of medial meniscus, current injury, right knee, subsequent encounter: Secondary | ICD-10-CM

## 2014-06-23 DIAGNOSIS — M25561 Pain in right knee: Secondary | ICD-10-CM

## 2014-06-23 NOTE — Progress Notes (Signed)
Pre visit review using our clinic review tool, if applicable. No additional management support is needed unless otherwise documented below in the visit note. 

## 2014-06-23 NOTE — Patient Instructions (Signed)
You are doing great.  [place] is doing well.  Continue ice Wear brace with walking for another 2 weeks.  OK not to wear brace with sitting, or standing in 1 area.  No twisting Continue the exercises.  See me again in 3 weeks.

## 2014-06-23 NOTE — Progress Notes (Signed)
  [name] [place], [place] [address] Phone: [phone] Subjective:     CC: Right knee pain follow up  [id number] [name] [name] is a 64 y.o. female coming in with complaint of right knee pain. Patient was seen previously for an MCL partial tear as well as a medial meniscal tear. Patient has been in a knee immobilizer for the last 2 weeks. Patient states that overall she is improving. States that the swelling is down. Patient states that she wearing the brace somewhat less than seems to be doing relatively okay. Patient continues with topical anti-inflammatories and icing. Patient has tried doing the exercises but states some of the strengthening exercises are uncomfortable. Patient denies any new symptoms.    Past medical history, social, surgical and family history all reviewed in electronic medical record.   Review of Systems: No headache, visual changes, nausea, vomiting, diarrhea, constipation, dizziness, abdominal pain, skin rash, fevers, chills, night sweats, weight loss, swollen lymph nodes, body aches, joint swelling, muscle aches, chest pain, shortness of breath, mood changes.   Objective Blood pressure 132/78, pulse 81, height 5\' 9"  (1.753 m), weight 247 lb (112.038 kg), SpO2 94 %.  General: No apparent distress alert and oriented x3 mood and affect normal, dressed appropriately.  HEENT: Pupils equal, extraocular movements intact  Respiratory: Patient's speak in full sentences and does not appear short of breath  Cardiovascular: No lower extremity edema, non tender, no erythema  Skin: Warm dry intact with no signs of infection or rash on extremities or on axial skeleton.  Abdomen: Soft nontender  Neuro: Cranial nerves II through XII are intact, neurovascularly intact in all extremities with 2+ DTRs and 2+ pulses.  Lymph: No lymphadenopathy of posterior or anterior cervical chain or axillae bilaterally.  Gait ambulating carefully  with crutches MSK:  Non tender with full range of motion and good stability and symmetric strength and tone of shoulders, elbows, wrist, hip, and ankles bilaterally.  Knee: Right Normal to inspection with no erythema or effusion or obvious bony abnormalities. Minimally tender over the medial joint line which is an improvement. ROM full in flexion and extension and lower leg rotation. Mild pain at full extension and full flexion. Ligaments with solid consistent endpoints decreased gapping of MCL.  Positive Mcmurray's, Apley's, and Thessalonian tests still positive. . painful patellar compression. Patellar glide without crepitus. Patellar and quadriceps tendons unremarkable. Hamstring and quadriceps strength is normal with mild discomfort at the insertion of the lateral hamstring.. Contralateral knee unremarkable  MSK US performed of: Right knee This study was ordered, performed, and interpreted by Charlann Boxer D.O.  Knee: All structures visualized. Anteromedial meniscus has significant decrease in hypoechoic changes and is only mildly displaced at this time. Patient is showing good interval healing.Marland Kitchen anterolateral, posteromedial, and posterolateral menisci unremarkable without tearing, fraying, effusion, or displacement. Patellar Tendon unremarkable on long and transverse views without effusion. No abnormality of prepatellar bursa. MCL does have what appears to be of tear but down from 50% to 30% of tendon. There is no significant retraction. Hypoechoic changes and increasing Doppler flow still noted. unremarkable on long and transverse views. No abnormality of origin of medial or lateral head of the gastrocnemius.  IMPRESSION:  Interval healing of acute meniscal tear as well as MCL partial tear.        Impression and Recommendations:     This case required medical decision making of moderate complexity.

## 2014-06-23 NOTE — Assessment & Plan Note (Signed)
Patient is actually healing very well. Discuss that I would continue the immobilizer with walking for another 2 weeks secondary to the MCL tear. We discussed continuing the icing regimen as well as topical anti-inflammatories. Patient was given an exercise prescription for stronger more strength training. Patient will make these changes and come back in 3-4 weeks. At that time as long as patient is doing well we'll get her out of the brace entirely.  Spent  25 minutes with patient face-to-face and had greater than 50% of counseling including as described above in assessment and plan.

## 2014-07-14 ENCOUNTER — Other Ambulatory Visit (INDEPENDENT_AMBULATORY_CARE_PROVIDER_SITE_OTHER): Payer: 59

## 2014-07-14 ENCOUNTER — Encounter: Payer: Self-pay | Admitting: Family Medicine

## 2014-07-14 ENCOUNTER — Ambulatory Visit (INDEPENDENT_AMBULATORY_CARE_PROVIDER_SITE_OTHER): Payer: 59 | Admitting: Family Medicine

## 2014-07-14 VITALS — BP 130/82 | HR 89 | Ht 69.0 in | Wt 247.0 lb

## 2014-07-14 DIAGNOSIS — S83241D Other tear of medial meniscus, current injury, right knee, subsequent encounter: Secondary | ICD-10-CM

## 2014-07-14 NOTE — Patient Instructions (Signed)
Good to see you New brace Increase omega 3 and decrease omega 6 foods.  Fish oil 3 grams daily.  New exercises 3-4 times a week See [place] again in 3 weeks.

## 2014-07-14 NOTE — Progress Notes (Signed)
Pre visit review using our clinic review tool, if applicable. No additional management support is needed unless otherwise documented below in the visit note. 

## 2014-07-14 NOTE — Progress Notes (Signed)
[name] [place], [place] [address] Phone: [phone] Subjective:     CC: Right knee pain follow up  [id number] [name] [name] is a 64 y.o. female coming in with complaint of right knee pain. Patient was seen previously for an MCL partial tear as well as a medial meniscal tear. Patient has been in a knee immobilizer for near 4 weeks. Patient though has been coming out of it to do some of the exercises. Patient states that the stability of the knee seems to be somewhat better. Patient is still having times when it feels like though pain can be severe. Patient denies any locking on her. Patient wants to remain active but also wants to heal appropriately. Denies any new symptoms. Feels though that when she is on the knee for long amount of time she does have more swelling.    Past medical history, social, surgical and family history all reviewed in electronic medical record.   Review of Systems: No headache, visual changes, nausea, vomiting, diarrhea, constipation, dizziness, abdominal pain, skin rash, fevers, chills, night sweats, weight loss, swollen lymph nodes, body aches, joint swelling, muscle aches, chest pain, shortness of breath, mood changes.   Objective Blood pressure 130/82, pulse 89, height 5\' 9"  (1.753 m), weight 247 lb (112.038 kg), SpO2 95 %.  General: No apparent distress alert and oriented x3 mood and affect normal, dressed appropriately.  HEENT: Pupils equal, extraocular movements intact  Respiratory: Patient's speak in full sentences and does not appear short of breath  Cardiovascular: No lower extremity edema, non tender, no erythema  Skin: Warm dry intact with no signs of infection or rash on extremities or on axial skeleton.  Abdomen: Soft nontender  Neuro: Cranial nerves II through XII are intact, neurovascularly intact in all extremities with 2+ DTRs and 2+ pulses.  Lymph: No lymphadenopathy of posterior or anterior  cervical chain or axillae bilaterally.  Gait ambulating carefully with crutches MSK:  Non tender with full range of motion and good stability and symmetric strength and tone of shoulders, elbows, wrist, hip, and ankles bilaterally.  Knee: Right Normal to inspection with no erythema or effusion or obvious bony abnormalities. Minimally tender over the medial joint line which is an improvement. ROM full in flexion and extension and lower leg rotation. Mild pain at full extension and full flexion. Ligaments with solid consistent endpoints decreased gapping of MCL again with continued improvement Positive Mcmurray's, Apley's, and Thessalonian tests still positive. . painful patellar compression. Patellar glide without crepitus. Patellar and quadriceps tendons unremarkable. Hamstring and quadriceps strength is normal with mild discomfort at the insertion of the lateral hamstring.. Contralateral knee unremarkable  MSK US performed of: Right knee This study was ordered, performed, and interpreted by Charlann Boxer D.O.  Knee: All structures visualized. Anteromedial meniscus still has moderate displacement the patient's tear previously seen seems to be improved. anterolateral, posteromedial, and posterolateral menisci unremarkable without tearing, fraying, effusion, or displacement. Patellar Tendon unremarkable on long and transverse views without effusion. No abnormality of prepatellar bursa. MCL does have what appears to be of tear but down from 30% to 20% of tendon. There is no significant retraction. Hypoechoic changes and increasing Doppler flow still noted. unremarkable on long and transverse views. No abnormality of origin of medial or lateral head of the gastrocnemius.  IMPRESSION:  Improvement of medial meniscus but still has healing to go with the MCL.   Procedure note [id number]; 15 minutes spent for Therapeutic  exercises as stated in above notes.  This included exercises focusing on  stretching, strengthening, with significant focus on eccentric aspects.   Proper technique shown and discussed handout in great detail with ATC.  All questions were discussed and answered.       Impression and Recommendations:     This case required medical decision making of moderate complexity.

## 2014-07-14 NOTE — Assessment & Plan Note (Signed)
Patient is one month out from the injury at this time. Encourage patient to start using them more often. Patient was given a different brace to give her more medial and lateral debility. Patient will start to bear weight as tolerated. Patient will do and icing protocol and patient did work with the flexor tendon to learn home exercises with more strengthening exercises. We will see how patient responds and will come back again in 3 weeks. At that time if continuing to have difficulty we will discuss the possibility of an MRI versus a cortisone injection to see if we can decrease the inflammation and help aid in the healing. Discussed with patient that she is still probably about 6 weeks out from being pain-free.  Spent  25 minutes with patient face-to-face and had greater than 50% of counseling including as described above in assessment and plan.

## 2014-07-19 ENCOUNTER — Ambulatory Visit (INDEPENDENT_AMBULATORY_CARE_PROVIDER_SITE_OTHER): Payer: 59 | Admitting: Internal Medicine

## 2014-07-19 ENCOUNTER — Other Ambulatory Visit (INDEPENDENT_AMBULATORY_CARE_PROVIDER_SITE_OTHER): Payer: 59

## 2014-07-19 ENCOUNTER — Encounter: Payer: Self-pay | Admitting: Internal Medicine

## 2014-07-19 VITALS — BP 122/78 | HR 73 | Temp 98.3°F | Resp 16 | Ht 69.0 in | Wt 248.0 lb

## 2014-07-19 DIAGNOSIS — E039 Hypothyroidism, unspecified: Secondary | ICD-10-CM

## 2014-07-19 DIAGNOSIS — Z0000 Encounter for general adult medical examination without abnormal findings: Secondary | ICD-10-CM | POA: Insufficient documentation

## 2014-07-19 DIAGNOSIS — I8390 Asymptomatic varicose veins of unspecified lower extremity: Secondary | ICD-10-CM | POA: Insufficient documentation

## 2014-07-19 LAB — CBC WITH DIFFERENTIAL/PLATELET
BASOS ABS: 0 10*3/uL (ref 0.0–0.1)
Basophils Relative: 0.3 % (ref 0.0–3.0)
Eosinophils Absolute: 0.1 10*3/uL (ref 0.0–0.7)
Eosinophils Relative: 1.4 % (ref 0.0–5.0)
HEMATOCRIT: 45.2 % (ref 36.0–46.0)
HEMOGLOBIN: 15.4 g/dL — AB (ref 12.0–15.0)
LYMPHS PCT: 21.7 % (ref 12.0–46.0)
Lymphs Abs: 1.7 10*3/uL (ref 0.7–4.0)
MCHC: 34 g/dL (ref 30.0–36.0)
MCV: 91.8 fl (ref 78.0–100.0)
MONOS PCT: 6 % (ref 3.0–12.0)
Monocytes Absolute: 0.5 10*3/uL (ref 0.1–1.0)
NEUTROS ABS: 5.5 10*3/uL (ref 1.4–7.7)
Neutrophils Relative %: 70.6 % (ref 43.0–77.0)
PLATELETS: 270 10*3/uL (ref 150.0–400.0)
RBC: 4.93 Mil/uL (ref 3.87–5.11)
RDW: 13.7 % (ref 11.5–15.5)
WBC: 7.8 10*3/uL (ref 4.0–10.5)

## 2014-07-19 LAB — COMPREHENSIVE METABOLIC PANEL
ALT: 25 U/L (ref 0–35)
AST: 27 U/L (ref 0–37)
Albumin: 4 g/dL (ref 3.5–5.2)
Alkaline Phosphatase: 77 U/L (ref 39–117)
BUN: 16 mg/dL (ref 6–23)
CO2: 29 mEq/L (ref 19–32)
CREATININE: 0.8 mg/dL (ref 0.40–1.20)
Calcium: 9.5 mg/dL (ref 8.4–10.5)
Chloride: 105 mEq/L (ref 96–112)
GFR: 77.01 mL/min (ref >60.00)
Glucose, Bld: 111 mg/dL — ABNORMAL HIGH (ref 70–99)
POTASSIUM: 4.5 meq/L (ref 3.5–5.1)
Sodium: 140 mEq/L (ref 135–145)
TOTAL PROTEIN: 6.9 g/dL (ref 6.0–8.3)
Total Bilirubin: 0.8 mg/dL (ref 0.2–1.2)

## 2014-07-19 LAB — LIPID PANEL
CHOL/HDL RATIO: 4
Cholesterol: 200 mg/dL (ref 0–200)
HDL: 49.3 mg/dL (ref >39.00)
LDL Cholesterol: 130 mg/dL — ABNORMAL HIGH (ref 0–99)
NonHDL: 150.7
Triglycerides: 102 mg/dL (ref 0.0–149.0)
VLDL: 20.4 mg/dL (ref 0.0–40.0)

## 2014-07-19 LAB — TSH: TSH: 2.88 u[IU]/mL (ref 0.35–4.50)

## 2014-07-19 NOTE — Assessment & Plan Note (Signed)
Vascular surgery referral

## 2014-07-19 NOTE — Progress Notes (Signed)
Pre visit review using our clinic review tool, if applicable. No additional management support is needed unless otherwise documented below in the visit note. 

## 2014-07-19 NOTE — Patient Instructions (Signed)
Preventive Care for Adults A healthy lifestyle and preventive care can promote health and wellness. Preventive health guidelines for women include the following key practices.  A routine yearly physical is a good way to check with your health care provider about your health and preventive screening. It is a chance to share any concerns and updates on your health and to receive a thorough exam.  Visit your dentist for a routine exam and preventive care every 6 months. Brush your teeth twice a day and floss once a day. Good oral hygiene prevents tooth decay and gum disease.  The frequency of eye exams is based on your age, health, family medical history, use of contact lenses, and other factors. Follow your health care provider's recommendations for frequency of eye exams.  Eat a healthy diet. Foods like vegetables, fruits, whole grains, low-fat dairy products, and lean protein foods contain the nutrients you need without too many calories. Decrease your intake of foods high in solid fats, added sugars, and salt. Eat the right amount of calories for you.Get information about a proper diet from your health care provider, if necessary.  Regular physical exercise is one of the most important things you can do for your health. Most adults should get at least 150 minutes of moderate-intensity exercise (any activity that increases your heart rate and causes you to sweat) each week. In addition, most adults need muscle-strengthening exercises on 2 or more days a week.  Maintain a healthy weight. The body mass index (BMI) is a screening tool to identify possible weight problems. It provides an estimate of body fat based on height and weight. Your health care provider can find your BMI and can help you achieve or maintain a healthy weight.For adults 20 years and older:  A BMI below 18.5 is considered underweight.  A BMI of 18.5 to 24.9 is normal.  A BMI of 25 to 29.9 is considered overweight.  A BMI of  30 and above is considered obese.  Maintain normal blood lipids and cholesterol levels by exercising and minimizing your intake of saturated fat. Eat a balanced diet with plenty of fruit and vegetables. Blood tests for lipids and cholesterol should begin at age 76 and be repeated every 5 years. If your lipid or cholesterol levels are high, you are over 50, or you are at high risk for heart disease, you may need your cholesterol levels checked more frequently.Ongoing high lipid and cholesterol levels should be treated with medicines if diet and exercise are not working.  If you smoke, find out from your health care provider how to quit. If you do not use tobacco, do not start.  Lung cancer screening is recommended for adults aged 22-80 years who are at high risk for developing lung cancer because of a history of smoking. A yearly low-dose CT scan of the lungs is recommended for people who have at least a 30-pack-year history of smoking and are a current smoker or have quit within the past 15 years. A pack year of smoking is smoking an average of 1 pack of cigarettes a day for 1 year (for example: 1 pack a day for 30 years or 2 packs a day for 15 years). Yearly screening should continue until the smoker has stopped smoking for at least 15 years. Yearly screening should be stopped for people who develop a health problem that would prevent them from having lung cancer treatment.  If you are pregnant, do not drink alcohol. If you are breastfeeding,  be very cautious about drinking alcohol. If you are not pregnant and choose to drink alcohol, do not have more than 1 drink per day. One drink is considered to be 12 ounces (355 mL) of beer, 5 ounces (148 mL) of wine, or 1.5 ounces (44 mL) of liquor.  Avoid use of street drugs. Do not share needles with anyone. Ask for help if you need support or instructions about stopping the use of drugs.  High blood pressure causes heart disease and increases the risk of  stroke. Your blood pressure should be checked at least every 1 to 2 years. Ongoing high blood pressure should be treated with medicines if weight loss and exercise do not work.  If you are 75-52 years old, ask your health care provider if you should take aspirin to prevent strokes.  Diabetes screening involves taking a blood sample to check your fasting blood sugar level. This should be done once every 3 years, after age 15, if you are within normal weight and without risk factors for diabetes. Testing should be considered at a younger age or be carried out more frequently if you are overweight and have at least 1 risk factor for diabetes.  Breast cancer screening is essential preventive care for women. You should practice "breast self-awareness." This means understanding the normal appearance and feel of your breasts and may include breast self-examination. Any changes detected, no matter how small, should be reported to a health care provider. Women in their 58s and 30s should have a clinical breast exam (CBE) by a health care provider as part of a regular health exam every 1 to 3 years. After age 16, women should have a CBE every year. Starting at age 53, women should consider having a mammogram (breast X-ray test) every year. Women who have a family history of breast cancer should talk to their health care provider about genetic screening. Women at a high risk of breast cancer should talk to their health care providers about having an MRI and a mammogram every year.  Breast cancer gene (BRCA)-related cancer risk assessment is recommended for women who have family members with BRCA-related cancers. BRCA-related cancers include breast, ovarian, tubal, and peritoneal cancers. Having family members with these cancers may be associated with an increased risk for harmful changes (mutations) in the breast cancer genes BRCA1 and BRCA2. Results of the assessment will determine the need for genetic counseling and  BRCA1 and BRCA2 testing.  Routine pelvic exams to screen for cancer are no longer recommended for nonpregnant women who are considered low risk for cancer of the pelvic organs (ovaries, uterus, and vagina) and who do not have symptoms. Ask your health care provider if a screening pelvic exam is right for you.  If you have had past treatment for cervical cancer or a condition that could lead to cancer, you need Pap tests and screening for cancer for at least 20 years after your treatment. If Pap tests have been discontinued, your risk factors (such as having a new sexual partner) need to be reassessed to determine if screening should be resumed. Some women have medical problems that increase the chance of getting cervical cancer. In these cases, your health care provider may recommend more frequent screening and Pap tests.  The HPV test is an additional test that may be used for cervical cancer screening. The HPV test looks for the virus that can cause the cell changes on the cervix. The cells collected during the Pap test can be  tested for HPV. The HPV test could be used to screen women aged 30 years and older, and should be used in women of any age who have unclear Pap test results. After the age of 30, women should have HPV testing at the same frequency as a Pap test.  Colorectal cancer can be detected and often prevented. Most routine colorectal cancer screening begins at the age of 50 years and continues through age 75 years. However, your health care provider may recommend screening at an earlier age if you have risk factors for colon cancer. On a yearly basis, your health care provider may provide home test kits to check for hidden blood in the stool. Use of a small camera at the end of a tube, to directly examine the colon (sigmoidoscopy or colonoscopy), can detect the earliest forms of colorectal cancer. Talk to your health care provider about this at age 50, when routine screening begins. Direct  exam of the colon should be repeated every 5-10 years through age 75 years, unless early forms of pre-cancerous polyps or small growths are found.  People who are at an increased risk for hepatitis B should be screened for this virus. You are considered at high risk for hepatitis B if:  You were born in a country where hepatitis B occurs often. Talk with your health care provider about which countries are considered high risk.  Your parents were born in a high-risk country and you have not received a shot to protect against hepatitis B (hepatitis B vaccine).  You have HIV or AIDS.  You use needles to inject street drugs.  You live with, or have sex with, someone who has hepatitis B.  You get hemodialysis treatment.  You take certain medicines for conditions like cancer, organ transplantation, and autoimmune conditions.  Hepatitis C blood testing is recommended for all people born from 1945 through 1965 and any individual with known risks for hepatitis C.  Practice safe sex. Use condoms and avoid high-risk sexual practices to reduce the spread of sexually transmitted infections (STIs). STIs include gonorrhea, chlamydia, syphilis, trichomonas, herpes, HPV, and human immunodeficiency virus (HIV). Herpes, HIV, and HPV are viral illnesses that have no cure. They can result in disability, cancer, and death.  You should be screened for sexually transmitted illnesses (STIs) including gonorrhea and chlamydia if:  You are sexually active and are younger than 24 years.  You are older than 24 years and your health care provider tells you that you are at risk for this type of infection.  Your sexual activity has changed since you were last screened and you are at an increased risk for chlamydia or gonorrhea. Ask your health care provider if you are at risk.  If you are at risk of being infected with HIV, it is recommended that you take a prescription medicine daily to prevent HIV infection. This is  called preexposure prophylaxis (PrEP). You are considered at risk if:  You are a heterosexual woman, are sexually active, and are at increased risk for HIV infection.  You take drugs by injection.  You are sexually active with a partner who has HIV.  Talk with your health care provider about whether you are at high risk of being infected with HIV. If you choose to begin PrEP, you should first be tested for HIV. You should then be tested every 3 months for as long as you are taking PrEP.  Osteoporosis is a disease in which the bones lose minerals and strength   with aging. This can result in serious bone fractures or breaks. The risk of osteoporosis can be identified using a bone density scan. Women ages 65 years and over and women at risk for fractures or osteoporosis should discuss screening with their health care providers. Ask your health care provider whether you should take a calcium supplement or vitamin D to reduce the rate of osteoporosis.  Menopause can be associated with physical symptoms and risks. Hormone replacement therapy is available to decrease symptoms and risks. You should talk to your health care provider about whether hormone replacement therapy is right for you.  Use sunscreen. Apply sunscreen liberally and repeatedly throughout the day. You should seek shade when your shadow is shorter than you. Protect yourself by wearing long sleeves, pants, a wide-brimmed hat, and sunglasses year round, whenever you are outdoors.  Once a month, do a whole body skin exam, using a mirror to look at the skin on your back. Tell your health care provider of new moles, moles that have irregular borders, moles that are larger than a pencil eraser, or moles that have changed in shape or color.  Stay current with required vaccines (immunizations).  Influenza vaccine. All adults should be immunized every year.  Tetanus, diphtheria, and acellular pertussis (Td, Tdap) vaccine. Pregnant women should  receive 1 dose of Tdap vaccine during each pregnancy. The dose should be obtained regardless of the length of time since the last dose. Immunization is preferred during the 27th-36th week of gestation. An adult who has not previously received Tdap or who does not know her vaccine status should receive 1 dose of Tdap. This initial dose should be followed by tetanus and diphtheria toxoids (Td) booster doses every 10 years. Adults with an unknown or incomplete history of completing a 3-dose immunization series with Td-containing vaccines should begin or complete a primary immunization series including a Tdap dose. Adults should receive a Td booster every 10 years.  Varicella vaccine. An adult without evidence of immunity to varicella should receive 2 doses or a second dose if she has previously received 1 dose. Pregnant females who do not have evidence of immunity should receive the first dose after pregnancy. This first dose should be obtained before leaving the health care facility. The second dose should be obtained 4-8 weeks after the first dose.  Human papillomavirus (HPV) vaccine. Females aged 13-26 years who have not received the vaccine previously should obtain the 3-dose series. The vaccine is not recommended for use in pregnant females. However, pregnancy testing is not needed before receiving a dose. If a female is found to be pregnant after receiving a dose, no treatment is needed. In that case, the remaining doses should be delayed until after the pregnancy. Immunization is recommended for any person with an immunocompromised condition through the age of 26 years if she did not get any or all doses earlier. During the 3-dose series, the second dose should be obtained 4-8 weeks after the first dose. The third dose should be obtained 24 weeks after the first dose and 16 weeks after the second dose.  Zoster vaccine. One dose is recommended for adults aged 60 years or older unless certain conditions are  present.  Measles, mumps, and rubella (MMR) vaccine. Adults born before 1957 generally are considered immune to measles and mumps. Adults born in 1957 or later should have 1 or more doses of MMR vaccine unless there is a contraindication to the vaccine or there is laboratory evidence of immunity to   each of the three diseases. A routine second dose of MMR vaccine should be obtained at least 28 days after the first dose for students attending postsecondary schools, health care workers, or international travelers. People who received inactivated measles vaccine or an unknown type of measles vaccine during 1963-1967 should receive 2 doses of MMR vaccine. People who received inactivated mumps vaccine or an unknown type of mumps vaccine before 1979 and are at high risk for mumps infection should consider immunization with 2 doses of MMR vaccine. For females of childbearing age, rubella immunity should be determined. If there is no evidence of immunity, females who are not pregnant should be vaccinated. If there is no evidence of immunity, females who are pregnant should delay immunization until after pregnancy. Unvaccinated health care workers born before 1957 who lack laboratory evidence of measles, mumps, or rubella immunity or laboratory confirmation of disease should consider measles and mumps immunization with 2 doses of MMR vaccine or rubella immunization with 1 dose of MMR vaccine.  Pneumococcal 13-valent conjugate (PCV13) vaccine. When indicated, a person who is uncertain of her immunization history and has no record of immunization should receive the PCV13 vaccine. An adult aged 19 years or older who has certain medical conditions and has not been previously immunized should receive 1 dose of PCV13 vaccine. This PCV13 should be followed with a dose of pneumococcal polysaccharide (PPSV23) vaccine. The PPSV23 vaccine dose should be obtained at least 8 weeks after the dose of PCV13 vaccine. An adult aged 19  years or older who has certain medical conditions and previously received 1 or more doses of PPSV23 vaccine should receive 1 dose of PCV13. The PCV13 vaccine dose should be obtained 1 or more years after the last PPSV23 vaccine dose.  Pneumococcal polysaccharide (PPSV23) vaccine. When PCV13 is also indicated, PCV13 should be obtained first. All adults aged 65 years and older should be immunized. An adult younger than age 65 years who has certain medical conditions should be immunized. Any person who resides in a nursing home or long-term care facility should be immunized. An adult smoker should be immunized. People with an immunocompromised condition and certain other conditions should receive both PCV13 and PPSV23 vaccines. People with human immunodeficiency virus (HIV) infection should be immunized as soon as possible after diagnosis. Immunization during chemotherapy or radiation therapy should be avoided. Routine use of PPSV23 vaccine is not recommended for American Indians, [place] Natives, or people younger than 65 years unless there are medical conditions that require PPSV23 vaccine. When indicated, people who have unknown immunization and have no record of immunization should receive PPSV23 vaccine. One-time revaccination 5 years after the first dose of PPSV23 is recommended for people aged 19-64 years who have chronic kidney failure, nephrotic syndrome, asplenia, or immunocompromised conditions. People who received 1-2 doses of PPSV23 before age 65 years should receive another dose of PPSV23 vaccine at age 65 years or later if at least 5 years have passed since the previous dose. Doses of PPSV23 are not needed for people immunized with PPSV23 at or after age 65 years.  Meningococcal vaccine. Adults with asplenia or persistent complement component deficiencies should receive 2 doses of quadrivalent meningococcal conjugate (MenACWY-D) vaccine. The doses should be obtained at least 2 months apart.  Microbiologists working with certain meningococcal bacteria, military recruits, people at risk during an outbreak, and people who travel to or live in countries with a high rate of meningitis should be immunized. A first-year college student up through age   21 years who is living in a residence hall should receive a dose if she did not receive a dose on or after her 16th birthday. Adults who have certain high-risk conditions should receive one or more doses of vaccine.  Hepatitis A vaccine. Adults who wish to be protected from this disease, have certain high-risk conditions, work with hepatitis A-infected animals, work in hepatitis A research labs, or travel to or work in countries with a high rate of hepatitis A should be immunized. Adults who were previously unvaccinated and who anticipate close contact with an international adoptee during the first 60 days after arrival in the [place] from a country with a high rate of hepatitis A should be immunized.  Hepatitis B vaccine. Adults who wish to be protected from this disease, have certain high-risk conditions, may be exposed to blood or other infectious body fluids, are household contacts or sex partners of hepatitis B positive people, are clients or workers in certain care facilities, or travel to or work in countries with a high rate of hepatitis B should be immunized.  Haemophilus influenzae type b (Hib) vaccine. A previously unvaccinated person with asplenia or sickle cell disease or having a scheduled splenectomy should receive 1 dose of Hib vaccine. Regardless of previous immunization, a recipient of a hematopoietic stem cell transplant should receive a 3-dose series 6-12 months after her successful transplant. Hib vaccine is not recommended for adults with HIV infection. Preventive Services / Frequency Ages 64 to 68 years  Blood pressure check.** / Every 1 to 2 years.  Lipid and cholesterol check.** / Every 5 years beginning at age  22.  Clinical breast exam.** / Every 3 years for women in their 88s and 53s.  BRCA-related cancer risk assessment.** / For women who have family members with a BRCA-related cancer (breast, ovarian, tubal, or peritoneal cancers).  Pap test.** / Every 2 years from ages 90 through 51. Every 3 years starting at age 21 through age 56 or 3 with a history of 3 consecutive normal Pap tests.  HPV screening.** / Every 3 years from ages 24 through ages 1 to 46 with a history of 3 consecutive normal Pap tests.  Hepatitis C blood test.** / For any individual with known risks for hepatitis C.  Skin self-exam. / Monthly.  Influenza vaccine. / Every year.  Tetanus, diphtheria, and acellular pertussis (Tdap, Td) vaccine.** / Consult your health care provider. Pregnant women should receive 1 dose of Tdap vaccine during each pregnancy. 1 dose of Td every 10 years.  Varicella vaccine.** / Consult your health care provider. Pregnant females who do not have evidence of immunity should receive the first dose after pregnancy.  HPV vaccine. / 3 doses over 6 months, if 72 and younger. The vaccine is not recommended for use in pregnant females. However, pregnancy testing is not needed before receiving a dose.  Measles, mumps, rubella (MMR) vaccine.** / You need at least 1 dose of MMR if you were born in 1957 or later. You may also need a 2nd dose. For females of childbearing age, rubella immunity should be determined. If there is no evidence of immunity, females who are not pregnant should be vaccinated. If there is no evidence of immunity, females who are pregnant should delay immunization until after pregnancy.  Pneumococcal 13-valent conjugate (PCV13) vaccine.** / Consult your health care provider.  Pneumococcal polysaccharide (PPSV23) vaccine.** / 1 to 2 doses if you smoke cigarettes or if you have certain conditions.  Meningococcal vaccine.** /  1 dose if you are age 19 to 21 years and a first-year college  student living in a residence hall, or have one of several medical conditions, you need to get vaccinated against meningococcal disease. You may also need additional booster doses.  Hepatitis A vaccine.** / Consult your health care provider.  Hepatitis B vaccine.** / Consult your health care provider.  Haemophilus influenzae type b (Hib) vaccine.** / Consult your health care provider. Ages 40 to 64 years  Blood pressure check.** / Every 1 to 2 years.  Lipid and cholesterol check.** / Every 5 years beginning at age 20 years.  Lung cancer screening. / Every year if you are aged 55-80 years and have a 30-pack-year history of smoking and currently smoke or have quit within the past 15 years. Yearly screening is stopped once you have quit smoking for at least 15 years or develop a health problem that would prevent you from having lung cancer treatment.  Clinical breast exam.** / Every year after age 40 years.  BRCA-related cancer risk assessment.** / For women who have family members with a BRCA-related cancer (breast, ovarian, tubal, or peritoneal cancers).  Mammogram.** / Every year beginning at age 40 years and continuing for as long as you are in good health. Consult with your health care provider.  Pap test.** / Every 3 years starting at age 30 years through age 65 or 70 years with a history of 3 consecutive normal Pap tests.  HPV screening.** / Every 3 years from ages 30 years through ages 65 to 70 years with a history of 3 consecutive normal Pap tests.  Fecal occult blood test (FOBT) of stool. / Every year beginning at age 50 years and continuing until age 75 years. You may not need to do this test if you get a colonoscopy every 10 years.  Flexible sigmoidoscopy or colonoscopy.** / Every 5 years for a flexible sigmoidoscopy or every 10 years for a colonoscopy beginning at age 50 years and continuing until age 75 years.  Hepatitis C blood test.** / For all people born from 1945 through  1965 and any individual with known risks for hepatitis C.  Skin self-exam. / Monthly.  Influenza vaccine. / Every year.  Tetanus, diphtheria, and acellular pertussis (Tdap/Td) vaccine.** / Consult your health care provider. Pregnant women should receive 1 dose of Tdap vaccine during each pregnancy. 1 dose of Td every 10 years.  Varicella vaccine.** / Consult your health care provider. Pregnant females who do not have evidence of immunity should receive the first dose after pregnancy.  Zoster vaccine.** / 1 dose for adults aged 60 years or older.  Measles, mumps, rubella (MMR) vaccine.** / You need at least 1 dose of MMR if you were born in 1957 or later. You may also need a 2nd dose. For females of childbearing age, rubella immunity should be determined. If there is no evidence of immunity, females who are not pregnant should be vaccinated. If there is no evidence of immunity, females who are pregnant should delay immunization until after pregnancy.  Pneumococcal 13-valent conjugate (PCV13) vaccine.** / Consult your health care provider.  Pneumococcal polysaccharide (PPSV23) vaccine.** / 1 to 2 doses if you smoke cigarettes or if you have certain conditions.  Meningococcal vaccine.** / Consult your health care provider.  Hepatitis A vaccine.** / Consult your health care provider.  Hepatitis B vaccine.** / Consult your health care provider.  Haemophilus influenzae type b (Hib) vaccine.** / Consult your health care provider. Ages 65   years and over  Blood pressure check.** / Every 1 to 2 years.  Lipid and cholesterol check.** / Every 5 years beginning at age 22 years.  Lung cancer screening. / Every year if you are aged 73-80 years and have a 30-pack-year history of smoking and currently smoke or have quit within the past 15 years. Yearly screening is stopped once you have quit smoking for at least 15 years or develop a health problem that would prevent you from having lung cancer  treatment.  Clinical breast exam.** / Every year after age 4 years.  BRCA-related cancer risk assessment.** / For women who have family members with a BRCA-related cancer (breast, ovarian, tubal, or peritoneal cancers).  Mammogram.** / Every year beginning at age 40 years and continuing for as long as you are in good health. Consult with your health care provider.  Pap test.** / Every 3 years starting at age 9 years through age 34 or 91 years with 3 consecutive normal Pap tests. Testing can be stopped between 65 and 70 years with 3 consecutive normal Pap tests and no abnormal Pap or HPV tests in the past 10 years.  HPV screening.** / Every 3 years from ages 57 years through ages 64 or 45 years with a history of 3 consecutive normal Pap tests. Testing can be stopped between 65 and 70 years with 3 consecutive normal Pap tests and no abnormal Pap or HPV tests in the past 10 years.  Fecal occult blood test (FOBT) of stool. / Every year beginning at age 15 years and continuing until age 17 years. You may not need to do this test if you get a colonoscopy every 10 years.  Flexible sigmoidoscopy or colonoscopy.** / Every 5 years for a flexible sigmoidoscopy or every 10 years for a colonoscopy beginning at age 86 years and continuing until age 71 years.  Hepatitis C blood test.** / For all people born from [birth year] through 1964 and any individual with known risks for hepatitis C.  Osteoporosis screening.** / A one-time screening for women ages 83 years and over and women at risk for fractures or osteoporosis.  Skin self-exam. / Monthly.  Influenza vaccine. / Every year.  Tetanus, diphtheria, and acellular pertussis (Tdap/Td) vaccine.** / 1 dose of Td every 10 years.  Varicella vaccine.** / Consult your health care provider.  Zoster vaccine.** / 1 dose for adults aged 61 years or older.  Pneumococcal 13-valent conjugate (PCV13) vaccine.** / Consult your health care provider.  Pneumococcal  polysaccharide (PPSV23) vaccine.** / 1 dose for all adults aged 28 years and older.  Meningococcal vaccine.** / Consult your health care provider.  Hepatitis A vaccine.** / Consult your health care provider.  Hepatitis B vaccine.** / Consult your health care provider.  Haemophilus influenzae type b (Hib) vaccine.** / Consult your health care provider. ** Family history and personal history of risk and conditions may change your health care provider's recommendations. Document Released: 07/22/2001 Document Revised: 10/10/2013 Document Reviewed: 10/21/2010 [facility] Patient Information 2015 [place], [place]. This information is not intended to replace advice given to you by your health care provider. Make sure you discuss any questions you have with your health care provider.

## 2014-07-19 NOTE — Assessment & Plan Note (Signed)
Vaccines were reviewed Colonoscopy, PAP and mammo are UTD Labs ordered, Tb test requested by her dermatologist Exam done Pt ed material was given

## 2014-07-19 NOTE — Progress Notes (Signed)
   Subjective:    Patient ID: [name] [name], female    DOB: [birth year], 64 y.o.   MRN: [id number]  HPI  She comes in today for a physical but she also complains about diffuse pain over bilateral lower leg areas and wants to see a vascular surgeon about varicose veins.  Review of Systems  Constitutional: Negative.  Negative for fever, chills, diaphoresis, appetite change and fatigue.  HENT: Negative.   Eyes: Negative.   Respiratory: Negative.  Negative for cough, choking, chest tightness, shortness of breath and stridor.   Cardiovascular: Negative.  Negative for chest pain, palpitations and leg swelling.  Gastrointestinal: Negative.  Negative for nausea, vomiting, abdominal pain, diarrhea and blood in stool.  Endocrine: Negative.   Genitourinary: Negative.   Musculoskeletal: Positive for arthralgias. Negative for myalgias, back pain, gait problem and neck pain.  Skin: Negative.  Negative for rash.  Allergic/Immunologic: Negative.   Neurological: Negative.  Negative for dizziness, tremors, weakness, light-headedness, numbness and headaches.  Hematological: Negative.  Negative for adenopathy. Does not bruise/bleed easily.  Psychiatric/Behavioral: Negative.        Objective:   Physical Exam  Constitutional: She is oriented to person, place, and time. She appears well-developed and well-nourished. No distress.  HENT:  Head: Normocephalic and atraumatic.  Mouth/Throat: Oropharynx is clear and moist. No oropharyngeal exudate.  Eyes: Conjunctivae are normal. Right eye exhibits no discharge. Left eye exhibits no discharge. No scleral icterus.  Neck: Normal range of motion. Neck supple. No JVD present. No tracheal deviation present. No thyromegaly present.  Cardiovascular: Normal rate, regular rhythm, normal heart sounds and intact distal pulses.  Exam reveals no gallop and no friction rub.   No murmur heard. Pulmonary/Chest: Effort normal and breath sounds normal. No stridor. No  respiratory distress. She has no wheezes. She has no rales. She exhibits no tenderness.  Abdominal: Soft. Bowel sounds are normal. She exhibits no distension and no mass. There is no tenderness. There is no rebound and no guarding.  Musculoskeletal: Normal range of motion. She exhibits no edema or tenderness.  Over BLE there are scattered areas of uncomplicated small varicose veins.  Lymphadenopathy:    She has no cervical adenopathy.  Neurological: She is oriented to person, place, and time.  Skin: Skin is warm and dry. No rash noted. She is not diaphoretic. No erythema. No pallor.  Psychiatric: She has a normal mood and affect. Her behavior is normal. Thought content normal.  Vitals reviewed.   Lab Results  Component Value Date   HGB 14.4 02/27/2012   CHOL 175 09/08/2012   TRIG 112.0 09/08/2012   HDL 42.70 09/08/2012   LDLCALC 110* 09/08/2012   ALT 26 11/02/2013   AST 28 11/02/2013   CREATININE 0.78 09/10/2012   TSH 3.04 05/30/2014   INR 1.0 ratio 06/21/2009        Assessment & Plan:

## 2014-07-21 LAB — QUANTIFERON TB GOLD ASSAY (BLOOD)
INTERFERON GAMMA RELEASE ASSAY: NEGATIVE
Mitogen value: 1.64 IU/mL
Quantiferon Nil Value: 0.03 IU/mL
Quantiferon Tb Ag Minus Nil Value: 0 IU/mL
TB AG VALUE: 0.02 [IU]/mL

## 2014-07-23 ENCOUNTER — Encounter: Payer: Self-pay | Admitting: Internal Medicine

## 2014-07-28 ENCOUNTER — Other Ambulatory Visit: Payer: Self-pay | Admitting: *Deleted

## 2014-07-28 DIAGNOSIS — I83893 Varicose veins of bilateral lower extremities with other complications: Secondary | ICD-10-CM

## 2014-08-02 ENCOUNTER — Ambulatory Visit (INDEPENDENT_AMBULATORY_CARE_PROVIDER_SITE_OTHER): Payer: 59 | Admitting: Internal Medicine

## 2014-08-02 ENCOUNTER — Encounter: Payer: Self-pay | Admitting: Internal Medicine

## 2014-08-02 VITALS — BP 142/74 | HR 77 | Temp 98.5°F | Resp 16 | Wt 251.0 lb

## 2014-08-02 DIAGNOSIS — H9190 Unspecified hearing loss, unspecified ear: Secondary | ICD-10-CM | POA: Insufficient documentation

## 2014-08-02 DIAGNOSIS — H6993 Unspecified Eustachian tube disorder, bilateral: Secondary | ICD-10-CM

## 2014-08-02 DIAGNOSIS — H9191 Unspecified hearing loss, right ear: Secondary | ICD-10-CM

## 2014-08-02 DIAGNOSIS — H6990 Unspecified Eustachian tube disorder, unspecified ear: Secondary | ICD-10-CM | POA: Insufficient documentation

## 2014-08-02 MED ORDER — MOMETASONE FUROATE 50 MCG/ACT NA SUSP: 4.0000 | Freq: Every day | NASAL | Status: AC

## 2014-08-02 MED ORDER — METHYLPREDNISOLONE ACETATE 80 MG/ML IJ SUSP
120.0000 mg | Freq: Once | INTRAMUSCULAR | Status: AC
  Administered 2014-08-02: 120 mg via INTRAMUSCULAR

## 2014-08-02 NOTE — Assessment & Plan Note (Signed)
Refer for hearing evaluation

## 2014-08-02 NOTE — Progress Notes (Signed)
Pre visit review using our clinic review tool, if applicable. No additional management support is needed unless otherwise documented below in the visit note. 

## 2014-08-02 NOTE — Patient Instructions (Signed)
Barotitis Media Barotitis media is inflammation of your middle ear. This occurs when the auditory tube (eustachian tube) leading from the back of your nose (nasopharynx) to your eardrum is blocked. This blockage may result from a cold, environmental allergies, or an upper respiratory infection. Unresolved barotitis media may lead to damage or hearing loss (barotrauma), which may become permanent. HOME CARE INSTRUCTIONS   Use medicines as recommended by your health care provider. Over-the-counter medicines will help unblock the canal and can help during times of air travel.  Do not put anything into your ears to clean or unplug them. Eardrops will not be helpful.  Do not swim, dive, or fly until your health care provider says it is all right to do so. If these activities are necessary, chewing gum with frequent, forceful swallowing may help. It is also helpful to hold your nose and gently blow to pop your ears for equalizing pressure changes. This forces air into the eustachian tube.  Only take over-the-counter or prescription medicines for pain, discomfort, or fever as directed by your health care provider.  A decongestant may be helpful in decongesting the middle ear and make pressure equalization easier. SEEK MEDICAL CARE IF:  You experience a serious form of dizziness in which you feel as if the room is spinning and you feel nauseated (vertigo).  Your symptoms only involve one ear. SEEK IMMEDIATE MEDICAL CARE IF:   You develop a severe headache, dizziness, or severe ear pain.  You have bloody or pus-like drainage from your ears.  You develop a fever.  Your problems do not improve or become worse. MAKE SURE YOU:   Understand these instructions.  Will watch your condition.  Will get help right away if you are not doing well or get worse. Document Released: 05/23/2000 Document Revised: 03/16/2013 Document Reviewed: 12/21/2012 [employer] Patient Information 2015 [employer]. This  information is not intended to replace advice given to you by your health care provider. Make sure you discuss any questions you have with your health care provider.  

## 2014-08-02 NOTE — Progress Notes (Signed)
Subjective:    Patient ID: [name] [name], female    DOB: September 14, 1950, 64 y.o.   MRN: [id number]  HPI Comments: She has a longstanding history of rt side hearing loss - then 5 days ago when went to a gun shooting range and wore ear protection but still feels like she may have damaged her hearing because now she has ringing in her right ear and feels like her hearing has declined some. She also complains of runny nose and nasal congestion with some popping in her ears.     Review of Systems  Constitutional: Negative.  Negative for fever, chills, diaphoresis, appetite change and fatigue.  HENT: Positive for congestion, hearing loss, postnasal drip and rhinorrhea. Negative for ear discharge, ear pain, facial swelling, nosebleeds, sinus pressure, sore throat and trouble swallowing.   Eyes: Negative.   Respiratory: Negative.   Cardiovascular: Negative.  Negative for chest pain, palpitations and leg swelling.  Gastrointestinal: Negative.  Negative for nausea, vomiting, abdominal pain, diarrhea, constipation and blood in stool.  Endocrine: Negative.   Musculoskeletal: Negative.  Negative for myalgias, back pain, joint swelling and arthralgias.  Skin: Negative.  Negative for rash.  Allergic/Immunologic: Negative.   Neurological: Negative.   Hematological: Negative.  Negative for adenopathy. Does not bruise/bleed easily.  Psychiatric/Behavioral: Negative.        Objective:   Physical Exam  Constitutional: She is oriented to person, place, and time. She appears well-developed and well-nourished. No distress.  HENT:  Head: Normocephalic and atraumatic.  Right Ear: Hearing, tympanic membrane, external ear and ear canal normal.  Left Ear: Hearing, tympanic membrane and ear canal normal.  Nose: No mucosal edema, rhinorrhea, sinus tenderness, septal deviation or nasal septal hematoma. No epistaxis. Right sinus exhibits no maxillary sinus tenderness and no frontal sinus tenderness. Left sinus  exhibits no maxillary sinus tenderness and no frontal sinus tenderness.  Mouth/Throat: Oropharynx is clear and moist and mucous membranes are normal. Mucous membranes are not pale, not dry and not cyanotic. No oral lesions. No trismus in the jaw. No uvula swelling. No oropharyngeal exudate, posterior oropharyngeal edema, posterior oropharyngeal erythema or tonsillar abscesses.  Eyes: Conjunctivae are normal. Right eye exhibits no discharge. Left eye exhibits no discharge. No scleral icterus.  Neck: Normal range of motion. Neck supple. No JVD present. No tracheal deviation present. No thyromegaly present.  Cardiovascular: Normal rate, regular rhythm, normal heart sounds and intact distal pulses.  Exam reveals no gallop and no friction rub.   No murmur heard. Pulmonary/Chest: Effort normal and breath sounds normal. No stridor. No respiratory distress. She has no wheezes. She has no rales. She exhibits no tenderness.  Abdominal: Soft. Bowel sounds are normal. She exhibits no distension and no mass. There is no tenderness. There is no rebound and no guarding.  Musculoskeletal: Normal range of motion. She exhibits no edema or tenderness.  Lymphadenopathy:    She has no cervical adenopathy.  Neurological: She is oriented to person, place, and time.  Skin: Skin is warm and dry. No rash noted. She is not diaphoretic. No erythema. No pallor.  Vitals reviewed.   Lab Results  Component Value Date   WBC 7.8 07/19/2014   HGB 15.4* 07/19/2014   HCT 45.2 07/19/2014   PLT 270.0 07/19/2014   GLUCOSE 111* 07/19/2014   CHOL 200 07/19/2014   TRIG 102.0 07/19/2014   HDL 49.30 07/19/2014   LDLCALC 130* 07/19/2014   ALT 25 07/19/2014   AST 27 07/19/2014   NA 140 07/19/2014  K 4.5 07/19/2014   CL 105 07/19/2014   CREATININE 0.80 07/19/2014   BUN 16 07/19/2014   CO2 29 07/19/2014   TSH 2.88 07/19/2014   INR 1.0 ratio 06/21/2009        Assessment & Plan:

## 2014-08-02 NOTE — Assessment & Plan Note (Signed)
I think she is having a flare of AR so I gave her an injection of depo-medrol IM and asked her to start using nasonex ns

## 2014-08-04 ENCOUNTER — Encounter: Payer: Self-pay | Admitting: Family Medicine

## 2014-08-04 ENCOUNTER — Ambulatory Visit (INDEPENDENT_AMBULATORY_CARE_PROVIDER_SITE_OTHER): Payer: 59 | Admitting: Family Medicine

## 2014-08-04 VITALS — BP 144/84 | HR 77 | Ht 69.0 in | Wt 251.0 lb

## 2014-08-04 DIAGNOSIS — S83419A Sprain of medial collateral ligament of unspecified knee, initial encounter: Secondary | ICD-10-CM | POA: Insufficient documentation

## 2014-08-04 DIAGNOSIS — S83411D Sprain of medial collateral ligament of right knee, subsequent encounter: Secondary | ICD-10-CM

## 2014-08-04 DIAGNOSIS — S83241D Other tear of medial meniscus, current injury, right knee, subsequent encounter: Secondary | ICD-10-CM

## 2014-08-04 NOTE — Patient Instructions (Signed)
Good to see you You are good to start walking 3 times a week.  Start 1/2 mile and increase 1/2 mile Wear brace only with  A lot of walking,  Ice after walking You are likely 1-2 months away from being pain free See me again in 6 weeks.

## 2014-08-04 NOTE — Progress Notes (Signed)
[name] [place], [name] [name] [address] Phone: [phone] Subjective:     CC: Right knee pain follow up  WCB:[id number] [name] [name] is a 64 y.o. female coming in with complaint of right knee pain. Patient was seen previously for an MCL partial tear as well as a medial meniscal tear. Patient has been in a knee immobilizer for near 4 weeks. Patient is now 2 months out from injury. Patient has been increasing her activity as tolerated. Patient states she continues to improve. Patient has been wearing the brace less and less. Patient continues with the Voltaren gel when she needs it. Patient continues seen at the end of the day. Still some mild discomfort mostly over the medial aspect of the knee. Patient still was to avoid any surgical intervention.   Past medical history, social, surgical and family history all reviewed in electronic medical record.   Review of Systems: No headache, visual changes, nausea, vomiting, diarrhea, constipation, dizziness, abdominal pain, skin rash, fevers, chills, night sweats, weight loss, swollen lymph nodes, body aches, joint swelling, muscle aches, chest pain, shortness of breath, mood changes.   Objective Blood pressure 144/84, pulse 77, height 5\' 9"  (1.753 m), weight 251 lb (113.853 kg), SpO2 96 %.  General: No apparent distress alert and oriented x3 mood and affect normal, dressed appropriately.  HEENT: Pupils equal, extraocular movements intact  Respiratory: Patient's speak in full sentences and does not appear short of breath  Cardiovascular: No lower extremity edema, non tender, no erythema  Skin: Warm dry intact with no signs of infection or rash on extremities or on axial skeleton.  Abdomen: Soft nontender  Neuro: Cranial nerves II through XII are intact, neurovascularly intact in all extremities with 2+ DTRs and 2+ pulses.  Lymph: No lymphadenopathy of posterior or anterior cervical chain or axillae  bilaterally.  Gait ambulating carefully with crutches MSK:  Non tender with full range of motion and good stability and symmetric strength and tone of shoulders, elbows, wrist, hip, and ankles bilaterally.  Knee: Right Normal to inspection with no erythema or effusion or obvious bony abnormalities. Minimally tender over the medial joint line which is an improvement. ROM full in flexion and extension and lower leg rotation. Mild pain at full extension and full flexion. Ligaments with solid consistent endpoints decreased gapping of MCL again with continued improvement Positive Mcmurray's, Apley's, and Thessalonian tests still positive. . painful patellar compression. Patellar glide without crepitus. Patellar and quadriceps tendons unremarkable. Hamstring and quadriceps strength is normal with mild discomfort at the insertion of the lateral hamstring.. Contralateral knee unremarkable  MSK US performed of: Right knee This study was ordered, performed, and interpreted by Charlann Boxer D.O.  Knee: All structures visualized. Anteromedial meniscus still has moderate displacement the patient's tear previously seen seems to be improved. anterolateral, posteromedial, and posterolateral menisci unremarkable without tearing, fraying, effusion, or displacement. Patellar Tendon unremarkable on long and transverse views without effusion. No abnormality of prepatellar bursa. MCL does have what appears to be of tear but down from 20% to 10% of tendon. There is no significant retraction. H decrease in hypoechoic changes,  unremarkable on long and transverse views. No abnormality of origin of medial or lateral head of the gastrocnemius.  IMPRESSION:  Healed medial meniscus but patient still has approximately 15% of the MCL to heal        Impression and Recommendations:     This case required medical decision making of moderate complexity.

## 2014-08-04 NOTE — Assessment & Plan Note (Signed)
Patient is improving slowly. I do think that patient's MCL will take another 4-8 weeks to completely heal. Patient will continue with the same regimen at this time but will start wearing the brace less and less. We discussed still avoiding any significant twisting motions. Patient is going to be going out of town in one month to be walking and we will started on an exercise prescription today. Patient and will come back and see me in 6 weeks to make sure that she is healing appropriately.

## 2014-08-04 NOTE — Progress Notes (Signed)
Pre visit review using our clinic review tool, if applicable. No additional management support is needed unless otherwise documented below in the visit note. 

## 2014-09-04 ENCOUNTER — Encounter: Payer: Self-pay | Admitting: Vascular Surgery

## 2014-09-05 ENCOUNTER — Encounter: Payer: Self-pay | Admitting: Vascular Surgery

## 2014-09-05 ENCOUNTER — Ambulatory Visit (INDEPENDENT_AMBULATORY_CARE_PROVIDER_SITE_OTHER): Payer: 59 | Admitting: Vascular Surgery

## 2014-09-05 ENCOUNTER — Ambulatory Visit (HOSPITAL_COMMUNITY)
Admission: RE | Admit: 2014-09-05 | Discharge: 2014-09-05 | Disposition: A | Discharge status: Home / Self Care | Payer: 59 | Source: Ambulatory Visit | Attending: Vascular Surgery | Admitting: Vascular Surgery

## 2014-09-05 VITALS — BP 119/87 | HR 59 | Resp 16 | Ht 69.0 in | Wt 250.0 lb

## 2014-09-05 DIAGNOSIS — I83899 Varicose veins of unspecified lower extremities with other complications: Secondary | ICD-10-CM | POA: Insufficient documentation

## 2014-09-05 DIAGNOSIS — I83893 Varicose veins of bilateral lower extremities with other complications: Secondary | ICD-10-CM

## 2014-09-05 NOTE — Progress Notes (Signed)
Subjective:     Patient ID: [name] [name], female   DOB: [birth year], 64 y.o.   MRN: [id number]  HPI this 64 year old female is evaluated for bilateral leg pain and possible varicosities. She has been having discomfort between the knee and the ankle in the pretibial region for the last few years. She will develop swelling in the ankles as the day progresses. She does not were elastic compression stockings. She has no history of DVT thrombophlebitis stasis ulcers or bleeding. She does have some bulging varicosities in the lateral thigh areas left worse than right which are not painful at the present time.  Past Medical History  Diagnosis Date  . Hypothyroidism   . Osteoporosis   . Rosacea   . Granuloma annulare   . Psoriasis   . Dyslipidemia   . Multinodular goiter   . Fibromyalgia   . Anxiety   . Diverticulosis of colon (without mention of hemorrhage)   . Family history of malignant neoplasm of gastrointestinal tract   . Snores   . GERD (gastroesophageal reflux disease)   . Arthritis   . Diverticulosis   . Hiatal hernia     History  Substance Use Topics  . Smoking status: Never Smoker   . Smokeless tobacco: Never Used  . Alcohol Use: No     Comment: wine, beer, mixed drinks on weekends    Family History  Problem Relation Age of Onset  . Colon cancer Father   . Cancer Father     colon  . Diverticulitis Mother   . Heart disease Mother   . Prostate cancer Maternal Uncle   . Breast cancer      great aunt  . Diabetes Paternal Grandfather   . Colon cancer Paternal Grandmother     Allergies  Allergen Reactions  . Erythromycin   . Nabumetone      Current outpatient prescriptions:  .  aspirin 81 MG tablet, Take 81 mg by mouth daily.  , Disp: , Rfl:  .  busPIRone (BUSPAR) 15 MG tablet, 1bid prn, Disp: 60 tablet, Rfl: 1 .  Calcitriol (VECTICAL) 3 MCG/GM cream, Apply topically at bedtime., Disp: , Rfl:  .  calcium-vitamin D (CALCIUM 500+D) 500-200 MG-UNIT per tablet, Take  1 tablet by mouth daily.  , Disp: , Rfl:  .  cetirizine (ZYRTEC) 10 MG tablet, Take 10 mg by mouth as needed., Disp: , Rfl:  .  diclofenac sodium (VOLTAREN) 1 % GEL, Apply 1 application topically as needed., Disp: , Rfl:  .  folic acid (FOLVITE) 1 MG tablet, Take 1 mg by mouth daily.  , Disp: , Rfl:  .  meloxicam (MOBIC) 15 MG tablet, Take 1 tablet (15 mg total) by mouth daily., Disp: 30 tablet, Rfl: 0 .  mometasone (NASONEX) 50 MCG/ACT nasal spray, Place 4 sprays into the nose daily., Disp: 17 g, Rfl: 12 .  Multiple Vitamin (MULTIVITAMIN) capsule, Take 1 capsule by mouth daily.  , Disp: , Rfl:  .  omeprazole (PRILOSEC) 20 MG capsule, Take 1 capsule (20 mg total) by mouth daily., Disp: 90 capsule, Rfl: 3 .  SYNTHROID 112 MCG tablet, Take 1 tablet (112 mcg total) by mouth daily before breakfast., Disp: 90 tablet, Rfl: 3 .  Ustekinumab (STELARA) 90 MG/ML SOSY, Inject 90 mcg into the skin. Every three months, Disp: , Rfl:  .  ValACYclovir HCl (VALTREX PO), Take 1 g by mouth as needed. Take 1 gram daily, Disp: , Rfl:  .  vitamin E (VITAMIN E)  200 UNIT capsule, Take 4 capsules (800 Units total) by mouth daily., Disp: , Rfl:   Filed Vitals:   09/05/14 1103  BP: 119/87  Pulse: 59  Resp: 16  Height: 5\' 9"  (1.753 m)  Weight: 250 lb (113.399 kg)    Body mass index is 36.9 kg/(m^2).           Review of Systems denies chest pain, dyspnea on exertion is mild, PND is denied, orthopnea as denied, no claudication. Patient has history of fibromyalgia    Objective:   Physical Exam BP 119/87 mmHg  Pulse 59  Resp 16  Ht 5\' 9"  (1.753 m)  Wt 250 lb (113.399 kg)  BMI 36.90 kg/m2  Gen.-alert and oriented x3 in no apparent distress-obese  HEENT normal for age Lungs no rhonchi or wheezing Cardiovascular regular rhythm no murmurs carotid pulses 3+ palpable no bruits audible Abdomen soft nontender no palpable masses-obese Musculoskeletal free of  major deformities Skin clear -no  rashes Neurologic normal Lower extremities 3+ femoral and dorsalis pedis pulses palpable bilaterally with no edema Left leg with linear row of small bulging varicosities in the lateral thigh with some superficial reticular veins lateral to the knee. No hyperpigmentation significant edema or ulceration noted distally. Right leg with some small varicosities laterally in the distal thigh area. No hyperpigmentation or ulceration noted.  Today I ordered bilateral venous duplex exam which I reviewed and interpreted. There is some reflux in the anterior accessory branch bilaterally with border line sized vein on the right and larger vein on the left. This is probably supplying the varicosities in the left lateral thigh. There is some reflux in left small saphenous vein but it is small caliber.       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

## 2014-09-11 ENCOUNTER — Encounter: Payer: Self-pay | Admitting: Internal Medicine

## 2014-09-11 ENCOUNTER — Ambulatory Visit: Payer: 59 | Attending: Internal Medicine | Admitting: Audiology

## 2014-09-11 ENCOUNTER — Ambulatory Visit (INDEPENDENT_AMBULATORY_CARE_PROVIDER_SITE_OTHER): Payer: 59 | Admitting: Internal Medicine

## 2014-09-11 VITALS — BP 132/88 | HR 82 | Temp 98.0°F | Ht 69.0 in | Wt 246.0 lb

## 2014-09-11 DIAGNOSIS — H9191 Unspecified hearing loss, right ear: Secondary | ICD-10-CM | POA: Diagnosis present

## 2014-09-11 DIAGNOSIS — H9319 Tinnitus, unspecified ear: Secondary | ICD-10-CM | POA: Insufficient documentation

## 2014-09-11 DIAGNOSIS — J209 Acute bronchitis, unspecified: Secondary | ICD-10-CM

## 2014-09-11 DIAGNOSIS — H903 Sensorineural hearing loss, bilateral: Secondary | ICD-10-CM | POA: Insufficient documentation

## 2014-09-11 DIAGNOSIS — H93213 Auditory recruitment, bilateral: Secondary | ICD-10-CM | POA: Diagnosis not present

## 2014-09-11 DIAGNOSIS — J069 Acute upper respiratory infection, unspecified: Secondary | ICD-10-CM

## 2014-09-11 MED ORDER — HYDROCODONE-HOMATROPINE 5-1.5 MG/5ML PO SYRP: 5.0000 mL | ORAL_SOLUTION | Freq: Three times a day (TID) | ORAL | Status: DC | PRN

## 2014-09-11 MED ORDER — AMOXICILLIN 500 MG PO CAPS: 500.0000 mg | ORAL_CAPSULE | Freq: Three times a day (TID) | ORAL | Status: DC

## 2014-09-11 NOTE — Patient Instructions (Addendum)
Wear hearing protection. 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    Sound level apps available on smartphones may help determine when too loud.  85dB or greater is potentially noise damaging.       Amplification helps make the signal louder and therefore often improves hearing and word recognition.  Amplification has many forms including hearing aids in one or both ears, an assistive listening device which have a microphone and speaker such as a small handheld device and/or even a surround sound system of speakers.  Amplification may be covered by some insurances, but not all.  It is important to note that hearing aids must be individually fit according to the hearing test results and the ear shape.  Audiologists and hearing aid dealers in [place] must be licensed in order to dispense hearing aids.  In addition, a trial period is mandated by law in our state because often amplification must be tried and then evaluated in order to determine benefit.       There are many excellent choices when it comes to amplification in our area and providers are listed in the phone book under hearing aids, may be affiliated with Ear, [place] and Throat physicians, are located at [place] as well as the [facility].  To see if [facility] insurance helps with hearing aid contact Verline Lema, [employer].   Equipment Distribution Services in [place] may help with obtaining one hearing aid or one captioned telephone if hearing loss and financial qualifications are met.  Please contact Rex Kras at [phone] .  List of Providers for [facility] Aid Distribution [facility] December 08, 2011 - December 06, 2013. The following hearing aid companies have contracted with [facility] to fit hearing aids for eligible applicants. Representatives of these companies are not [facility] employees. Inclusion on this list means the  company agrees to the terms of contract pricing and inclusion is not an endorsement of their services over those who are not contracted with the division. An applicant may choose any provider listed from your region to assist in obtaining hearing aids through this service. If problems arise during this process, please contact the [address] that provided the application.  [facility] Vendor List 1 Updated; October 04, 2012   [facility] [place], [place]  [facility]  [place] [phone] [facility], [place]  [phone]

## 2014-09-11 NOTE — Progress Notes (Signed)
   Subjective:    Patient ID: [name] [name], female    DOB: [birth year], 64 y.o.   MRN: [id number]  HPI Her symptoms began 09/04/14 as a sore throat which persisted for 2 days. As of 3/30 she began to have a cough and chest congestion. The cough is productive of green sputum and disturbs sleep. The cough is paroxysmal and has been associated with abdominal pain. She's had clear to light green nasal discharge as well. She had some frontal sinus discomfort when bending over 4/3. This has not been a persistent phenomena. She's had some intermittent sore throat since 3/30. She also describes fatigue.  She's been using Mucinex D, Sudafed, Tessalon Perles. She's been sleeping with a wedge because of the cough. She is using saline nasal rinse.  Review of Systems  She denies facial pain, otic pain, otic discharge, optic discharge, loss of smell, dental pain, or halitosis. The cough is not associated with shortness of breath or wheezing.      Objective:   Physical Exam General appearance:Adequately nourished; no acute distress or increased work of breathing is present.   BMI:  Lymphatic: No  lymphadenopathy about the head, neck, or axilla .  Eyes: No conjunctival inflammation or lid edema is present. There is no scleral icterus.  Ears:  External ear exam shows no significant lesions or deformities.  Otoscopic examination reveals clear canals, tympanic membranes are intact bilaterally without bulging, retraction, inflammation or discharge.  Nose:  External nasal examination shows no deformity or inflammation. Nasal mucosa are markedly erythematous without lesions or exudates No septal dislocation or deviation.No obstruction to airflow.   Oral exam: Dental hygiene is good; lips and gums are healthy appearing.There is no oropharyngeal erythema or exudate .  Neck:  No deformities, thyromegaly, masses, or tenderness noted.   Supple with full range of motion without pain.   Heart:  Normal rate and  regular rhythm. S1 and S2 normal without gallop, murmur, click, or rub.loud S4 present.  Lungs:Chest clear to auscultation; no wheezes, rhonchi,rales ,or rubs present.  Extremities:  No cyanosis, edema, or clubbing  noted    Skin: Warm & dry w/o tenting or jaundice. No significant lesions or rash.        Assessment & Plan:  #1 upper respiratory infection with intermittent sore throat # bronchitis , acute w/o bronchospasm Plan: See orders and recommendations

## 2014-09-11 NOTE — Progress Notes (Signed)
Pre visit review using our clinic review tool, if applicable. No additional management support is needed unless otherwise documented below in the visit note. 

## 2014-09-11 NOTE — Patient Instructions (Signed)
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
## 2014-09-19 ENCOUNTER — Encounter: Payer: Self-pay | Admitting: Family Medicine

## 2014-09-19 ENCOUNTER — Ambulatory Visit (INDEPENDENT_AMBULATORY_CARE_PROVIDER_SITE_OTHER): Payer: 59 | Admitting: Family Medicine

## 2014-09-19 VITALS — BP 130/86 | HR 72 | Ht 69.0 in | Wt 246.0 lb

## 2014-09-19 DIAGNOSIS — S83241D Other tear of medial meniscus, current injury, right knee, subsequent encounter: Secondary | ICD-10-CM | POA: Diagnosis not present

## 2014-09-19 NOTE — Patient Instructions (Signed)
Good to see you You are good to go OK to bike and enjoy retirement.  Ice is your friend Expect some discomfort with increasing activity.  See me again when you need me.

## 2014-09-19 NOTE — Progress Notes (Signed)
  [name] [place], [place] [address] Phone: [phone] Subjective:     CC: Right knee pain follow up  [name] [name] [name] [name] is a 64 y.o. female coming in with complaint of right knee pain. Patient was seen previously for an MCL partial tear as well as a medial meniscal tear. Patient is now approximately 3 months from injury. Patient has been increasing her activity as tolerated continuing over-the-counter medicines and topical anti-inflammatories. Patient states overall she is approximately 90% better. Still some mild discomfort walking longer than 1 hour. Denies any instability. Patient denies any sleeping pain, not wearing the brace anymore. Patient is not taking any medicine on a regular basis.   Past medical history, social, surgical and family history all reviewed in electronic medical record.   Review of Systems: No headache, visual changes, nausea, vomiting, diarrhea, constipation, dizziness, abdominal pain, skin rash, fevers, chills, night sweats, weight loss, swollen lymph nodes, body aches, joint swelling, muscle aches, chest pain, shortness of breath, mood changes.   Objective Blood pressure 130/86, pulse 72, height 5\' 9"  (1.753 m), weight 246 lb (111.585 kg), SpO2 96 %.  General: No apparent distress alert and oriented x3 mood and affect normal, dressed appropriately.  HEENT: Pupils equal, extraocular movements intact  Respiratory: Patient's speak in full sentences and does not appear short of breath  Cardiovascular: No lower extremity edema, non tender, no erythema  Skin: Warm [name] intact with no signs of infection or rash on extremities or on axial skeleton.  Abdomen: Soft nontender  Neuro: Cranial nerves II through XII are intact, neurovascularly intact in all extremities with 2+ DTRs and 2+ pulses.  Lymph: No lymphadenopathy of posterior or anterior cervical chain or axillae bilaterally.  Gait ambulating carefully with  crutches MSK:  Non tender with full range of motion and good stability and symmetric strength and tone of shoulders, elbows, wrist, hip, and ankles bilaterally.  Knee: Right Normal to inspection with no erythema or effusion or obvious bony abnormalities. Minimally tender over the medial joint line which is an improvement. ROM full in flexion and extension and lower leg rotation. Mild pain at full extension and full flexion. MCL healed Negative Mcmurray's, Apley's, and Thessalonian tests still positive. . painful patellar compression. Patellar glide without crepitus. Patellar and quadriceps tendons unremarkable. Hamstring and quadriceps strength is normal with mild discomfort at the insertion of the lateral hamstring.. Contralateral knee unremarkable  MSK US performed of: Right knee This study was ordered, performed, and interpreted by Charlann Boxer D.O.  Knee: All structures visualized. Anteromedial meniscus still has moderate displacement but no tear anterolateral, posteromedial, and posterolateral menisci unremarkable without tearing, fraying, effusion, or displacement. Patellar Tendon unremarkable on long and transverse views without effusion. No abnormality of prepatellar bursa. MCL is healed at this time with good scar tissue formation. There is no significant retraction. No abnormality of origin of medial or lateral head of the gastrocnemius.  IMPRESSION healed medial meniscus and healed MCL     Impression and Recommendations:     This case required medical decision making of moderate complexity.

## 2014-09-19 NOTE — Progress Notes (Signed)
Pre visit review using our clinic review tool, if applicable. No additional management support is needed unless otherwise documented below in the visit note. 

## 2014-09-19 NOTE — Assessment & Plan Note (Signed)
Patient appears to be healed at this time. Encourage her to continue conservative therapy 3-4 times weekly including the exercises and icing pedicle. As long as patient continues to improve patient in follow-up on an as needed.

## 2014-10-05 ENCOUNTER — Encounter: Payer: Self-pay | Admitting: Family Medicine

## 2014-10-05 ENCOUNTER — Other Ambulatory Visit (INDEPENDENT_AMBULATORY_CARE_PROVIDER_SITE_OTHER): Payer: 59

## 2014-10-05 ENCOUNTER — Ambulatory Visit (INDEPENDENT_AMBULATORY_CARE_PROVIDER_SITE_OTHER): Payer: 59 | Admitting: Family Medicine

## 2014-10-05 VITALS — BP 136/80 | HR 65 | Ht 69.0 in | Wt 246.0 lb

## 2014-10-05 DIAGNOSIS — M25561 Pain in right knee: Secondary | ICD-10-CM

## 2014-10-05 NOTE — Progress Notes (Signed)
Pre visit review using our clinic review tool, if applicable. No additional management support is needed unless otherwise documented below in the visit note. 

## 2014-10-05 NOTE — Patient Instructions (Signed)
Good to see you Ice after activity Wear brace with a lot of walking We can do orthovisc as well.  Give it 2 weeks and see how you are  Otherwise we will do injection.

## 2014-10-05 NOTE — Assessment & Plan Note (Signed)
Patient is having more right knee pain. Is more on the lateral aspect and there is may be an acute on chronic meniscal tear noted the patient is doing much better already with no therapy whatsoever. Patient will start the exercises more a regular basis and wear the brace. Patient will continue to increase her activity and monitor for any signs and symptoms. Patient come back though again in 2 weeks for further evaluation.  She does have mild to moderate osteophytic changes it could be concerning to some of the discomfort or otherwise patient does have an intra-articular pathology and further imaging may be necessary especially if any instability occurs.

## 2014-10-05 NOTE — Progress Notes (Signed)
[name] [place], [place] [address] Phone: [phone] Subjective:     CC: Right knee pain follow up  LFY:[id number] [name] [name] is a 64 y.o. female coming in with complaint of right knee pain. Patient was near completely healed from a medial meniscal tear and unfortunately patient didn't give in her car and when she pivoted she felt pain on the lateral aspect of her knee. Patient was having more pain on the medial and part of her knee previously. Patient states that it did swell significantly for 2 days and then has gone down tremendously. Continues to have some mild discomfort but significantly better than what it was. Patient states that it only hurts with twisting motions and does not seem to be hurting her with regular walking or daily activities. This did occur week ago and seems to be improving slowly. Rates the severity of pain and now is 3 out of 10 but it was 0-10 before the incident.   Past medical history, social, surgical and family history all reviewed in electronic medical record.   Review of Systems: No headache, visual changes, nausea, vomiting, diarrhea, constipation, dizziness, abdominal pain, skin rash, fevers, chills, night sweats, weight loss, swollen lymph nodes, body aches, joint swelling, muscle aches, chest pain, shortness of breath, mood changes.   Objective Blood pressure 136/80, pulse 65, height 5\' 9"  (1.753 m), weight 246 lb (111.585 kg), SpO2 96 %.  General: No apparent distress alert and oriented x3 mood and affect normal, dressed appropriately.  HEENT: Pupils equal, extraocular movements intact  Respiratory: Patient's speak in full sentences and does not appear short of breath  Cardiovascular: No lower extremity edema, non tender, no erythema  Skin: Warm dry intact with no signs of infection or rash on extremities or on axial skeleton.  Abdomen: Soft nontender  Neuro: Cranial nerves II through XII are intact,  neurovascularly intact in all extremities with 2+ DTRs and 2+ pulses.  Lymph: No lymphadenopathy of posterior or anterior cervical chain or axillae bilaterally.  Gait ambulating carefully with crutches MSK:  Non tender with full range of motion and good stability and symmetric strength and tone of shoulders, elbows, wrist, hip, and ankles bilaterally.  Knee: Right Normal to inspection with no erythema or effusion or obvious bony abnormalities. Tenderness over the lateral joint line than previously. ROM full in flexion and extension and lower leg rotation. Mild pain at full extension and full flexion. MCL healed Negative Mcmurray's, Apley's, and Thessalonian tests still positive. . painful patellar compression. Patellar glide without crepitus. Patellar and quadriceps tendons unremarkable. Hamstring and quadriceps strength is normal with mild discomfort at the insertion of the lateral hamstring.. Contralateral knee unremarkable  MSK US performed of: Right knee This study was ordered, performed, and interpreted by Charlann Boxer D.O.  Knee: All structures visualized. Continued despite 7 anterior medial meniscus but patient does have a new hypoechoic changes surrounding the lateral meniscus. No significant displacement noted. Patient does have some mild narrowing of lateral and medial joint space.  Patellar Tendon unremarkable on long and transverse views without effusion. No abnormality of prepatellar bursa. MCL is healed at this time with good scar tissue formation. There is no significant retraction. No abnormality of origin of medial or lateral head of the gastrocnemius.  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

## 2014-10-10 ENCOUNTER — Encounter: Payer: Self-pay | Admitting: Family Medicine

## 2014-10-11 ENCOUNTER — Other Ambulatory Visit: Payer: Self-pay

## 2014-10-11 DIAGNOSIS — Z1231 Encounter for screening mammogram for malignant neoplasm of breast: Secondary | ICD-10-CM

## 2014-10-27 ENCOUNTER — Encounter: Payer: Self-pay | Admitting: Family Medicine

## 2014-10-27 ENCOUNTER — Other Ambulatory Visit: Payer: Self-pay | Admitting: Family Medicine

## 2014-10-27 ENCOUNTER — Ambulatory Visit (INDEPENDENT_AMBULATORY_CARE_PROVIDER_SITE_OTHER): Payer: 59 | Admitting: Family Medicine

## 2014-10-27 VITALS — BP 130/82 | HR 77 | Ht 69.0 in | Wt 246.0 lb

## 2014-10-27 DIAGNOSIS — S83241D Other tear of medial meniscus, current injury, right knee, subsequent encounter: Secondary | ICD-10-CM | POA: Diagnosis not present

## 2014-10-27 DIAGNOSIS — M129 Arthropathy, unspecified: Secondary | ICD-10-CM | POA: Diagnosis not present

## 2014-10-27 DIAGNOSIS — IMO0002 Reserved for concepts with insufficient information to code with codable children: Secondary | ICD-10-CM | POA: Insufficient documentation

## 2014-10-27 NOTE — Assessment & Plan Note (Signed)
Patient will be fitted for a brace. Mild osteophytic changes on x-ray previously but these were nonweightbearing. I believe that this is a underlying problem that is giving her some of the discomfort.

## 2014-10-27 NOTE — Progress Notes (Signed)
  [name] [place], [place] [address] Phone: [phone] Subjective:    CC: Right knee pain follow up  [id number] [name] [name] is a 64 y.o. female coming in with complaint of right knee pain. Patient was near completely healed from a medial meniscal tear and unfortunately patient didn't give in her car and when she pivoted she felt pain on the lateral aspect of her knee. Patient was found to have a an acute on chronic lateral meniscal tear. Patient was making some mild improvement with conservative therapy. Patient did have oral anti-inflammatories, topical anti-implant was, as well as continue the icing protocol and home exercises. Patient states she is improving. Patient has not been following directions and is doing significant more activity. Patient denies any worsening symptoms but has not made any significant benefit. Patient though has been doing a lot of yard work and is able to do all activities of daily living. Patient states is from time to time she has increasing pain.   Past medical history, social, surgical and family history all reviewed in electronic medical record.   Review of Systems: No headache, visual changes, nausea, vomiting, diarrhea, constipation, dizziness, abdominal pain, skin rash, fevers, chills, night sweats, weight loss, swollen lymph nodes, body aches, joint swelling, muscle aches, chest pain, shortness of breath, mood changes.   Objective Blood pressure 130/82, pulse 77, height 5\' 9"  (1.753 m), weight 246 lb (111.585 kg), SpO2 95 %.  General: No apparent distress alert and oriented x3 mood and affect normal, dressed appropriately.  HEENT: Pupils equal, extraocular movements intact  Respiratory: Patient's speak in full sentences and does not appear short of breath  Cardiovascular: No lower extremity edema, non tender, no erythema  Skin: Warm dry intact with no signs of infection or rash on extremities or on axial  skeleton.  Abdomen: Soft nontender  Neuro: Cranial nerves II through XII are intact, neurovascularly intact in all extremities with 2+ DTRs and 2+ pulses.  Lymph: No lymphadenopathy of posterior or anterior cervical chain or axillae bilaterally.  Gait ambulating carefully with crutches MSK:  Non tender with full range of motion and good stability and symmetric strength and tone of shoulders, elbows, wrist, hip, and ankles bilaterally.  Knee: Right Normal to inspection with no erythema or effusion or obvious bony abnormalities. Minimal tenderness over the lateral and medial joint lines ROM full in flexion and extension and lower leg rotation. Mild pain at full extension and full flexion. MCL healed Negative Mcmurray's, Apley's, and Thessalonian tests still positive. . painful patellar compression. Patellar glide without crepitus. Patellar and quadriceps tendons unremarkable. Hamstring and quadriceps strength is normal with mild discomfort at the insertion of the lateral hamstring.. Contralateral knee unremarkable  MSK US performed of: Right knee This study was ordered, performed, and interpreted by Charlann Boxer D.O.  Knee: All structures visualized. Lateral hypoechoic changes and was seen previously has completely resolved.  Patellar Tendon unremarkable on long and transverse views without effusion. No abnormality of prepatellar bursa. MCL is healed . There is no significant retraction. No abnormality of origin of medial or lateral head of the gastrocnemius.  IMPRESSION meniscal chronic degenerative changes noted     Impression and Recommendations:     This case required medical decision making of moderate complexity.

## 2014-10-27 NOTE — Telephone Encounter (Signed)
Refill done.  

## 2014-10-27 NOTE — Progress Notes (Signed)
Pre visit review using our clinic review tool, if applicable. No additional management support is needed unless otherwise documented below in the visit note. 

## 2014-10-27 NOTE — Assessment & Plan Note (Signed)
Patient overall is doing relatively well. We discussed icing regimen and home exercises. We discussed icing and how this can be beneficial. We also discussed that patient continues to do all her daily activities. I do think patient does have mild osteophytic changes of the knee and this could be contribute in. 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 Patient then will try a new custom brace to allow her to continue to do activity. Patient will come back and see me again if pain seems to worsen. We would consider injection to see if this would be beneficial.

## 2014-10-31 ENCOUNTER — Ambulatory Visit
Admission: RE | Admit: 2014-10-31 | Discharge: 2014-10-31 | Disposition: A | Discharge status: Home / Self Care | Payer: 59 | Source: Ambulatory Visit

## 2014-10-31 DIAGNOSIS — Z1231 Encounter for screening mammogram for malignant neoplasm of breast: Secondary | ICD-10-CM

## 2014-10-31 LAB — HM MAMMOGRAPHY: HM Mammogram: NORMAL

## 2014-11-21 ENCOUNTER — Encounter: Payer: Self-pay | Admitting: Internal Medicine

## 2014-11-21 ENCOUNTER — Ambulatory Visit (INDEPENDENT_AMBULATORY_CARE_PROVIDER_SITE_OTHER)
Admission: RE | Admit: 2014-11-21 | Discharge: 2014-11-21 | Disposition: A | Discharge status: Home / Self Care | Payer: 59 | Source: Ambulatory Visit | Attending: Internal Medicine | Admitting: Internal Medicine

## 2014-11-21 ENCOUNTER — Other Ambulatory Visit (INDEPENDENT_AMBULATORY_CARE_PROVIDER_SITE_OTHER): Payer: 59

## 2014-11-21 ENCOUNTER — Ambulatory Visit (INDEPENDENT_AMBULATORY_CARE_PROVIDER_SITE_OTHER): Payer: 59 | Admitting: Internal Medicine

## 2014-11-21 VITALS — BP 130/80 | HR 76 | Temp 98.1°F | Resp 16 | Ht 69.0 in | Wt 248.0 lb

## 2014-11-21 DIAGNOSIS — E538 Deficiency of other specified B group vitamins: Secondary | ICD-10-CM

## 2014-11-21 DIAGNOSIS — E038 Other specified hypothyroidism: Secondary | ICD-10-CM

## 2014-11-21 DIAGNOSIS — M810 Age-related osteoporosis without current pathological fracture: Secondary | ICD-10-CM

## 2014-11-21 DIAGNOSIS — E785 Hyperlipidemia, unspecified: Secondary | ICD-10-CM | POA: Diagnosis not present

## 2014-11-21 DIAGNOSIS — R002 Palpitations: Secondary | ICD-10-CM | POA: Diagnosis not present

## 2014-11-21 DIAGNOSIS — K760 Fatty (change of) liver, not elsewhere classified: Secondary | ICD-10-CM

## 2014-11-21 LAB — VITAMIN D 25 HYDROXY (VIT D DEFICIENCY, FRACTURES): VITD: 30.74 ng/mL (ref 30.00–100.00)

## 2014-11-21 LAB — TSH: TSH: 1.88 u[IU]/mL (ref 0.35–4.50)

## 2014-11-21 LAB — CBC WITH DIFFERENTIAL/PLATELET
BASOS ABS: 0 10*3/uL (ref 0.0–0.1)
Basophils Relative: 0.5 % (ref 0.0–3.0)
Eosinophils Absolute: 0.1 10*3/uL (ref 0.0–0.7)
Eosinophils Relative: 1.3 % (ref 0.0–5.0)
HEMATOCRIT: 45.8 % (ref 36.0–46.0)
HEMOGLOBIN: 15.2 g/dL — AB (ref 12.0–15.0)
LYMPHS PCT: 20.1 % (ref 12.0–46.0)
Lymphs Abs: 1.5 10*3/uL (ref 0.7–4.0)
MCHC: 33.2 g/dL (ref 30.0–36.0)
MCV: 92.5 fl (ref 78.0–100.0)
Monocytes Absolute: 0.4 10*3/uL (ref 0.1–1.0)
Monocytes Relative: 5.9 % (ref 3.0–12.0)
NEUTROS PCT: 72.2 % (ref 43.0–77.0)
Neutro Abs: 5.3 10*3/uL (ref 1.4–7.7)
PLATELETS: 277 10*3/uL (ref 150.0–400.0)
RBC: 4.95 Mil/uL (ref 3.87–5.11)
RDW: 13.9 % (ref 11.5–15.5)
WBC: 7.4 10*3/uL (ref 4.0–10.5)

## 2014-11-21 LAB — COMPREHENSIVE METABOLIC PANEL
ALBUMIN: 4.1 g/dL (ref 3.5–5.2)
ALT: 18 U/L (ref 0–35)
AST: 24 U/L (ref 0–37)
Alkaline Phosphatase: 71 U/L (ref 39–117)
BUN: 13 mg/dL (ref 6–23)
CALCIUM: 9.5 mg/dL (ref 8.4–10.5)
CHLORIDE: 105 meq/L (ref 96–112)
CO2: 28 meq/L (ref 19–32)
Creatinine, Ser: 0.79 mg/dL (ref 0.40–1.20)
GFR: 78.05 mL/min (ref >60.00)
GLUCOSE: 111 mg/dL — AB (ref 70–99)
Potassium: 4.3 mEq/L (ref 3.5–5.1)
Sodium: 139 mEq/L (ref 135–145)
Total Bilirubin: 0.8 mg/dL (ref 0.2–1.2)
Total Protein: 6.8 g/dL (ref 6.0–8.3)

## 2014-11-21 LAB — VITAMIN B12: Vitamin B-12: 512 pg/mL (ref 211–911)

## 2014-11-21 MED ORDER — BUSPIRONE HCL 15 MG PO TABS: 15.0000 mg | ORAL_TABLET | Freq: Two times a day (BID) | ORAL | Status: DC

## 2014-11-21 NOTE — Progress Notes (Signed)
Pre visit review using our clinic review tool, if applicable. No additional management support is needed unless otherwise documented below in the visit note. 

## 2014-11-21 NOTE — Patient Instructions (Signed)

## 2014-11-21 NOTE — Progress Notes (Signed)
Subjective:  Patient ID: [name] [name], female    DOB: February 14, 1951  Age: 64 y.o. MRN: [id number]  CC: Hypothyroidism   HPI [name] [name] presents for the complaint of palpitations - for the last 2 months she has had an occasional episode of feeling her heart "thumping" in her chest while at rest. The sensation lasts for about one hour and there are no other associated symptoms. She thinks the beat may also be irregular but not fast or slow.  Outpatient Prescriptions Prior to Visit  Medication Sig Dispense Refill  . aspirin 81 MG tablet Take 81 mg by mouth daily.      . Calcitriol (VECTICAL) 3 MCG/GM cream Apply topically at bedtime.    . calcium-vitamin D (CALCIUM 500+D) 500-200 MG-UNIT per tablet Take 1 tablet by mouth daily.      . cetirizine (ZYRTEC) 10 MG tablet Take 10 mg by mouth as needed.    . diclofenac sodium (VOLTAREN) 1 % GEL Apply 1 application topically as needed.    . folic acid (FOLVITE) 1 MG tablet Take 1 mg by mouth daily.      . meloxicam (MOBIC) 15 MG tablet TAKE 1 TABLET BY MOUTH DAILY. 30 tablet 3  . mometasone (NASONEX) 50 MCG/ACT nasal spray Place 4 sprays into the nose daily. 17 g 12  . Multiple Vitamin (MULTIVITAMIN) capsule Take 1 capsule by mouth daily.      Marland Kitchen omeprazole (PRILOSEC) 20 MG capsule Take 1 capsule (20 mg total) by mouth daily. 90 capsule 3  . SYNTHROID 112 MCG tablet Take 1 tablet (112 mcg total) by mouth daily before breakfast. 90 tablet 3  . Ustekinumab (STELARA) 90 MG/ML SOSY Inject 90 mcg into the skin. Every three months    . ValACYclovir HCl (VALTREX PO) Take 1 g by mouth as needed. Take 1 gram daily    . vitamin E (VITAMIN E) 200 UNIT capsule Take 4 capsules (800 Units total) by mouth daily.    Marland Kitchen amoxicillin (AMOXIL) 500 MG capsule Take 1 capsule (500 mg total) by mouth 3 (three) times daily. 30 capsule 0  . busPIRone (BUSPAR) 15 MG tablet 1bid prn 60 tablet 1  . HYDROcodone-homatropine (HYCODAN) 5-1.5 MG/5ML syrup Take 5 mLs by mouth  every 8 (eight) hours as needed for cough. 120 mL 0   No facility-administered medications prior to visit.    ROS Review of Systems  Constitutional: Negative.  Negative for fever, chills, diaphoresis, appetite change and fatigue.  HENT: Negative.  Negative for congestion and trouble swallowing.   Eyes: Negative.   Respiratory: Negative.  Negative for cough, choking, chest tightness, shortness of breath, wheezing and stridor.   Cardiovascular: Positive for palpitations. Negative for chest pain and leg swelling.  Gastrointestinal: Negative.  Negative for nausea, vomiting, abdominal pain, diarrhea, constipation and blood in stool.  Endocrine: Negative.   Genitourinary: Negative.  Negative for dysuria, urgency, hematuria and difficulty urinating.  Musculoskeletal: Negative.  Negative for myalgias, back pain, joint swelling and arthralgias.  Skin: Negative.  Negative for rash.  Allergic/Immunologic: Negative.   Neurological: Negative.  Negative for dizziness, syncope, speech difficulty, light-headedness and numbness.  Hematological: Negative.  Negative for adenopathy. Does not bruise/bleed easily.  Psychiatric/Behavioral: Negative.     Objective:  BP 130/80 mmHg  Pulse 76  Temp(Src) 98.1 F (36.7 C) (Oral)  Resp 16  Ht 5\' 9"  (1.753 m)  Wt 248 lb (112.492 kg)  BMI 36.61 kg/m2  SpO2 95%  BP Readings from Last  3 Encounters:  11/21/14 130/80  10/27/14 130/82  10/05/14 136/80    Wt Readings from Last 3 Encounters:  11/21/14 248 lb (112.492 kg)  10/27/14 246 lb (111.585 kg)  10/05/14 246 lb (111.585 kg)    Physical Exam  Constitutional: She is oriented to person, place, and time. She appears well-developed and well-nourished. No distress.  HENT:  Head: Normocephalic and atraumatic.  Mouth/Throat: Oropharynx is clear and moist. No oropharyngeal exudate.  Eyes: Conjunctivae are normal. Right eye exhibits no discharge. Left eye exhibits no discharge. No scleral icterus.  Neck:  Normal range of motion. Neck supple. No JVD present. No tracheal deviation present. No thyromegaly present.  Cardiovascular: Normal rate, regular rhythm, S1 normal, S2 normal, normal heart sounds and intact distal pulses.  Exam reveals no gallop, no S3, no S4 and no friction rub.   No murmur heard. EKG - NSR No abnormal findings  Pulmonary/Chest: Effort normal and breath sounds normal. No stridor. No respiratory distress. She has no wheezes. She has no rales. She exhibits no tenderness.  Abdominal: Soft. Bowel sounds are normal. She exhibits no distension and no mass. There is no tenderness. There is no rebound and no guarding.  Musculoskeletal: Normal range of motion. She exhibits no edema or tenderness.  Lymphadenopathy:    She has no cervical adenopathy.  Neurological: She is oriented to person, place, and time.  Skin: Skin is warm and dry. No rash noted. She is not diaphoretic. No erythema. No pallor.  Psychiatric: She has a normal mood and affect. Her behavior is normal. Judgment and thought content normal.  Vitals reviewed.   Lab Results  Component Value Date   WBC 7.4 11/21/2014   HGB 15.2* 11/21/2014   HCT 45.8 11/21/2014   PLT 277.0 11/21/2014   GLUCOSE 111* 11/21/2014   CHOL 200 07/19/2014   TRIG 102.0 07/19/2014   HDL 49.30 07/19/2014   LDLCALC 130* 07/19/2014   ALT 18 11/21/2014   AST 24 11/21/2014   NA 139 11/21/2014   K 4.3 11/21/2014   CL 105 11/21/2014   CREATININE 0.79 11/21/2014   BUN 13 11/21/2014   CO2 28 11/21/2014   TSH 1.88 11/21/2014   INR 1.0 ratio 06/21/2009    Mm Screening Breast Tomo Bilateral  10/31/2014   CLINICAL DATA:  Screening.  EXAM: DIGITAL SCREENING BILATERAL MAMMOGRAM WITH 3D TOMO WITH CAD  COMPARISON:  Previous exam(s).  ACR Breast Density Category c: The breast tissue is heterogeneously dense, which may obscure small masses.  FINDINGS: There are no findings suspicious for malignancy. Images were processed with CAD.  IMPRESSION: No  mammographic evidence of malignancy. A result letter of this screening mammogram will be mailed directly to the patient.  RECOMMENDATION: Screening mammogram in one year. (Code:SM-B-01Y)  BI-RADS CATEGORY  1: Negative.   Electronically Signed   By: Lajean Manes M.D.   On: 10/31/2014 14:54    Assessment & Plan:   Bridey was seen today for hypothyroidism.  Diagnoses and all orders for this visit:  Other specified hypothyroidism - will recheck her TSh and will adjust her dose if needed Orders: -     TSH; Future  Fatty liver disease, nonalcoholic Orders: -     Comprehensive metabolic panel; Future -     CBC with Differential/Platelet; Future  Osteoporosis Orders: -     Vit D  25 hydroxy (rtn osteoporosis monitoring); Future -     DG Bone Density; Future  B12 nutritional deficiency Orders: -  CBC with Differential/Platelet; Future -     Vitamin B12; Future  Hyperlipidemia with target LDL less than 160 Orders: -     Comprehensive metabolic panel; Future -     TSH; Future  Palpitations - her exam and EKG are WNL today, will check her labs to screen for secondary causes, I have asked her to have an event monitor performed to see if there is a dysrhythmia that is causing the palpitations Orders: -     EKG 12-Lead -     Cardiac event monitor; Future  Other orders -     Discontinue: busPIRone (BUSPAR) 15 MG tablet; Take 1 tablet (15 mg total) by mouth 2 (two) times daily. -     busPIRone (BUSPAR) 15 MG tablet; Take 1 tablet (15 mg total) by mouth 2 (two) times daily.   I have discontinued Ms. Bottomley's amoxicillin and HYDROcodone-homatropine. I am also having her maintain her aspirin, calcium-vitamin D, folic acid, multivitamin, ValACYclovir HCl (VALTREX PO), Calcitriol, diclofenac sodium, cetirizine, Ustekinumab, omeprazole, vitamin E, SYNTHROID, mometasone, meloxicam, and busPIRone.  Meds ordered this encounter  Medications  . DISCONTD: busPIRone (BUSPAR) 15 MG tablet    Sig:  Take 1 tablet (15 mg total) by mouth 2 (two) times daily.    Dispense:  60 tablet    Refill:  11  . busPIRone (BUSPAR) 15 MG tablet    Sig: Take 1 tablet (15 mg total) by mouth 2 (two) times daily.    Dispense:  180 tablet    Refill:  3     Follow-up: Return in about 2 months (around 01/21/2015).  Scarlette Calico, MD

## 2014-11-28 ENCOUNTER — Encounter: Payer: Self-pay | Admitting: Internal Medicine

## 2014-11-28 LAB — HM DEXA SCAN

## 2014-12-04 ENCOUNTER — Other Ambulatory Visit: Payer: Self-pay

## 2014-12-06 LAB — HM PAP SMEAR

## 2014-12-06 LAB — CYTOLOGY - PAP

## 2015-01-09 ENCOUNTER — Ambulatory Visit (INDEPENDENT_AMBULATORY_CARE_PROVIDER_SITE_OTHER): Payer: 59

## 2015-01-09 DIAGNOSIS — R002 Palpitations: Secondary | ICD-10-CM | POA: Diagnosis not present

## 2015-01-23 ENCOUNTER — Ambulatory Visit: Payer: 59 | Admitting: Internal Medicine

## 2015-01-29 ENCOUNTER — Ambulatory Visit (INDEPENDENT_AMBULATORY_CARE_PROVIDER_SITE_OTHER): Payer: 59 | Admitting: Internal Medicine

## 2015-01-29 ENCOUNTER — Encounter: Payer: Self-pay | Admitting: Internal Medicine

## 2015-01-29 VITALS — BP 122/76 | HR 71 | Temp 98.1°F | Resp 16 | Ht 69.0 in | Wt 243.0 lb

## 2015-01-29 DIAGNOSIS — M810 Age-related osteoporosis without current pathological fracture: Secondary | ICD-10-CM | POA: Diagnosis not present

## 2015-01-29 DIAGNOSIS — E559 Vitamin D deficiency, unspecified: Secondary | ICD-10-CM | POA: Diagnosis not present

## 2015-01-29 LAB — VITAMIN D 25 HYDROXY (VIT D DEFICIENCY, FRACTURES): Vit D, 25-Hydroxy: 58

## 2015-01-29 MED ORDER — CHOLECALCIFEROL 1.25 MG (50000 UT) PO TABS: 1.0000 | ORAL_TABLET | ORAL | Status: DC

## 2015-01-29 NOTE — Progress Notes (Signed)
Subjective:  Patient ID: [name] [name], female    DOB: 1951/03/13  Age: 64 y.o. MRN: [id number]  CC: Hypothyroidism   HPI [name] [name] presents for follow-up. She is concerned about her vitamin D level and would rather take a large weekly dose then a daily dose. She complains of heartburn, but no odynophagia or dysphagia. She has decided to stop the proton pump inhibitor and switch to an H2 blocker.  Outpatient Prescriptions Prior to Visit  Medication Sig Dispense Refill  . aspirin 81 MG tablet Take 81 mg by mouth daily.      . busPIRone (BUSPAR) 15 MG tablet Take 1 tablet (15 mg total) by mouth 2 (two) times daily. 180 tablet 3  . Calcitriol (VECTICAL) 3 MCG/GM cream Apply topically at bedtime.    . calcium-vitamin D (CALCIUM 500+D) 500-200 MG-UNIT per tablet Take 1 tablet by mouth daily.      . cetirizine (ZYRTEC) 10 MG tablet Take 10 mg by mouth as needed.    . diclofenac sodium (VOLTAREN) 1 % GEL Apply 1 application topically as needed.    . folic acid (FOLVITE) 1 MG tablet Take 1 mg by mouth daily.      . meloxicam (MOBIC) 15 MG tablet TAKE 1 TABLET BY MOUTH DAILY. 30 tablet 3  . mometasone (NASONEX) 50 MCG/ACT nasal spray Place 4 sprays into the nose daily. 17 g 12  . Multiple Vitamin (MULTIVITAMIN) capsule Take 1 capsule by mouth daily.      Marland Kitchen SYNTHROID 112 MCG tablet Take 1 tablet (112 mcg total) by mouth daily before breakfast. 90 tablet 3  . Ustekinumab (STELARA) 90 MG/ML SOSY Inject 90 mcg into the skin. Every three months    . ValACYclovir HCl (VALTREX PO) Take 1 g by mouth as needed. Take 1 gram daily    . vitamin E (VITAMIN E) 200 UNIT capsule Take 4 capsules (800 Units total) by mouth daily.    Marland Kitchen omeprazole (PRILOSEC) 20 MG capsule Take 1 capsule (20 mg total) by mouth daily. 90 capsule 3   No facility-administered medications prior to visit.    ROS Review of Systems  Constitutional: Negative.  Negative for fever, chills, diaphoresis, appetite change and fatigue.    HENT: Negative.  Negative for trouble swallowing and voice change.   Eyes: Negative.   Respiratory: Negative.  Negative for apnea, cough, choking, chest tightness, shortness of breath and stridor.   Cardiovascular: Negative.  Negative for chest pain, palpitations and leg swelling.  Gastrointestinal: Negative.  Negative for nausea, vomiting, abdominal pain, diarrhea, constipation and blood in stool.  Endocrine: Negative.   Genitourinary: Negative.   Musculoskeletal: Negative.   Skin: Negative.   Allergic/Immunologic: Negative.   Neurological: Negative.  Negative for dizziness and light-headedness.  Hematological: Negative.  Negative for adenopathy. Does not bruise/bleed easily.  Psychiatric/Behavioral: Negative.     Objective:  BP 122/76 mmHg  Pulse 71  Temp(Src) 98.1 F (36.7 C) (Oral)  Resp 16  Ht 5\' 9"  (1.753 m)  Wt 243 lb (110.224 kg)  BMI 35.87 kg/m2  SpO2 96%  BP Readings from Last 3 Encounters:  01/29/15 122/76  11/21/14 130/80  10/27/14 130/82    Wt Readings from Last 3 Encounters:  01/29/15 243 lb (110.224 kg)  11/21/14 248 lb (112.492 kg)  10/27/14 246 lb (111.585 kg)    Physical Exam  Constitutional: She is oriented to person, place, and time. She appears well-developed and well-nourished. No distress.  HENT:  Mouth/Throat: Oropharynx is  clear and moist. No oropharyngeal exudate.  Eyes: Conjunctivae are normal. Right eye exhibits no discharge. Left eye exhibits no discharge. No scleral icterus.  Neck: Normal range of motion. Neck supple. No JVD present. No tracheal deviation present. No thyromegaly present.  Cardiovascular: Normal rate, regular rhythm, normal heart sounds and intact distal pulses.  Exam reveals no gallop and no friction rub.   No murmur heard. Pulmonary/Chest: Effort normal and breath sounds normal. No stridor. No respiratory distress. She has no wheezes. She has no rales. She exhibits no tenderness.  Abdominal: Soft. Bowel sounds are  normal. She exhibits no distension and no mass. There is no tenderness. There is no rebound and no guarding.  Musculoskeletal: Normal range of motion. She exhibits no edema or tenderness.  Lymphadenopathy:    She has no cervical adenopathy.  Neurological: She is oriented to person, place, and time.  Skin: Skin is warm and dry. No rash noted. She is not diaphoretic. No erythema. No pallor.    Lab Results  Component Value Date   WBC 7.4 11/21/2014   HGB 15.2* 11/21/2014   HCT 45.8 11/21/2014   PLT 277.0 11/21/2014   GLUCOSE 111* 11/21/2014   CHOL 200 07/19/2014   TRIG 102.0 07/19/2014   HDL 49.30 07/19/2014   LDLCALC 130* 07/19/2014   ALT 18 11/21/2014   AST 24 11/21/2014   NA 139 11/21/2014   K 4.3 11/21/2014   CL 105 11/21/2014   CREATININE 0.79 11/21/2014   BUN 13 11/21/2014   CO2 28 11/21/2014   TSH 1.88 11/21/2014   INR 1.0 ratio 06/21/2009    Dg Bone Density  11/27/2014   Date of study: 11/21/14 Exam: DUAL X-RAY ABSORPTIOMETRY (DXA) FOR BONE MINERAL DENSITY (BMD) Instrument: [employer] Provider: PCP Indication: follow up for low BMD Comparison: none (please note that it is not possible to compare data from  different instruments) Clinical data: Pt is a postmenopausal 65 y.o. female with previous h/o  fracture. On calcium and vitamin D.  Results:  Lumbar spine (L1-L4) Femoral neck (FN) 33% distal radius  T-score 1.4 RFN:-1.5 LFN:-1.8 n/a  Change in BMD from previous DXA test (%) Up 0.9 % Down 9.6% n/a  (*) statistically significant  Assessment: the BMD is low according to the [employer] classification for  osteoporosis (see below). Fracture risk: moderate FRAX score: 10 year major osteoporotic risk: 14.7%. 10 year hip fracture  risk: 1.7%. These are under the thresholds for treatment of 20% and 3%,  respectively. Comments: the technical quality of the study is good. Evaluation for secondary causes should be considered if clinically  indicated.  Recommend optimizing  calcium (1200 mg/day) and vitamin D (800 IU/day)  intake.  Followup: Repeat BMD is appropriate after 2 years or after 1-2 years if  starting treatment.  WHO criteria for diagnosis of osteoporosis in postmenopausal women and in  men 61 y/o or older:  - normal: T-score -1.0 to + 1.0 - osteopenia/low bone density: T-score between -2.5 and -1.0 - osteoporosis: T-score below -2.5 - severe osteoporosis: T-score below -2.5 with history of fragility  fracture Note: although not part of the WHO classification, the presence of a  fragility fracture, regardless of the T-score, should be considered  diagnostic of osteoporosis, provided other causes for the fracture have  been excluded.  Treatment: The National Osteoporosis Foundation recommends that treatment  be considered in postmenopausal women and men age 59 or older with: 1. Hip or vertebral (clinical or morphometric) fracture 2.  T-score of - 2.5 or lower at the spine or hip 3. 10-year fracture probability by FRAX of at least 20% for a major  osteoporotic fracture and 3% for a hip fracture  [place]:   [name] [name] was seen today for hypothyroidism.  Diagnoses and all orders for this visit:  Osteoporosis -     Cholecalciferol 50000 UNITS TABS; Take 1 tablet by mouth once a week.  Vitamin D deficiency -     Cholecalciferol 50000 UNITS TABS; Take 1 tablet by mouth once a week.  I have discontinued Ms. Smucker's omeprazole. I am also having her start on Cholecalciferol. Additionally, I am having her maintain her aspirin, calcium-vitamin D, folic acid, multivitamin, ValACYclovir HCl (VALTREX PO), Calcitriol, diclofenac sodium, cetirizine, Ustekinumab, vitamin E, SYNTHROID, mometasone, meloxicam, busPIRone, and ranitidine.  Meds ordered this encounter  Medications  . ranitidine (ZANTAC) 150 MG tablet    Sig: Take 150 mg by mouth 2 (two) times daily.  . Cholecalciferol 50000 UNITS TABS    Sig: Take 1 tablet by mouth once a week.     Dispense:  12 tablet    Refill:  3     Follow-up: Return in about 4 months (around 05/31/2015).  Scarlette Calico, MD

## 2015-01-29 NOTE — Progress Notes (Signed)
Pre visit review using our clinic review tool, if applicable. No additional management support is needed unless otherwise documented below in the visit note. 

## 2015-01-29 NOTE — Patient Instructions (Signed)
Hypothyroidism The thyroid is a large gland located in the lower front of your neck. The thyroid gland helps control metabolism. Metabolism is how your body handles food. It controls metabolism with the hormone thyroxine. When this gland is underactive (hypothyroid), it produces too little hormone.  CAUSES These include:   Absence or destruction of thyroid tissue.  Goiter due to iodine deficiency.  Goiter due to medications.  Congenital defects (since birth).  Problems with the pituitary. This causes a lack of TSH (thyroid stimulating hormone). This hormone tells the thyroid to turn out more hormone. SYMPTOMS  Lethargy (feeling as though you have no energy)  Cold intolerance  Weight gain (in spite of normal food intake)  Dry skin  Coarse hair  Menstrual irregularity (if severe, may lead to infertility)  Slowing of thought processes Cardiac problems are also caused by insufficient amounts of thyroid hormone. Hypothyroidism in the newborn is cretinism, and is an extreme form. It is important that this form be treated adequately and immediately or it will lead rapidly to retarded physical and mental development. DIAGNOSIS  To prove hypothyroidism, your caregiver may do blood tests and ultrasound tests. Sometimes the signs are hidden. It may be necessary for your caregiver to watch this illness with blood tests either before or after diagnosis and treatment. TREATMENT  Low levels of thyroid hormone are increased by using synthetic thyroid hormone. This is a safe, effective treatment. It usually takes about four weeks to gain the full effects of the medication. After you have the full effect of the medication, it will generally take another four weeks for problems to leave. Your caregiver may start you on low doses. If you have had heart problems the dose may be gradually increased. It is generally not an emergency to get rapidly to normal. HOME CARE INSTRUCTIONS   Take your  medications as your caregiver suggests. Let your caregiver know of any medications you are taking or start taking. Your caregiver will help you with dosage schedules.  As your condition improves, your dosage needs may increase. It will be necessary to have continuing blood tests as suggested by your caregiver.  Report all suspected medication side effects to your caregiver. SEEK MEDICAL CARE IF: Seek medical care if you develop:  Sweating.  Tremulousness (tremors).  Anxiety.  Rapid weight loss.  Heat intolerance.  Emotional swings.  Diarrhea.  Weakness. SEEK IMMEDIATE MEDICAL CARE IF:  You develop chest pain, an irregular heart beat (palpitations), or a rapid heart beat. MAKE SURE YOU:   Understand these instructions.  Will watch your condition.  Will get help right away if you are not doing well or get worse. Document Released: 05/26/2005 Document Revised: 08/18/2011 Document Reviewed: 01/14/2008 [employer] Patient Information 2015 [employer]. This information is not intended to replace advice given to you by your health care provider. Make sure you discuss any questions you have with your health care provider.  

## 2015-01-31 ENCOUNTER — Ambulatory Visit (INDEPENDENT_AMBULATORY_CARE_PROVIDER_SITE_OTHER): Payer: 59 | Admitting: Endocrinology

## 2015-01-31 VITALS — BP 126/82 | HR 75 | Temp 98.0°F | Resp 14 | Ht 69.0 in | Wt 239.2 lb

## 2015-01-31 DIAGNOSIS — E041 Nontoxic single thyroid nodule: Secondary | ICD-10-CM

## 2015-01-31 DIAGNOSIS — M8580 Other specified disorders of bone density and structure, unspecified site: Secondary | ICD-10-CM | POA: Diagnosis not present

## 2015-01-31 DIAGNOSIS — R7301 Impaired fasting glucose: Secondary | ICD-10-CM

## 2015-01-31 NOTE — Progress Notes (Signed)
[name] [name] 64 y.o.           Reason for Appointment:  Multiple questions and followup visit   History of Present Illness:   1.  Questionable thyroid swelling She says that she was seeing her primary care physician and was concerned that he may have found some abnormality on the neck exam and this may have been causing the trouble swallowing.  She wanted to have this examined again  She previously has had a thyroid nodule, long-standing since about 2005 when the needle aspiration showed colloid nodule with benign follicular cells.  She does not feel any local pressure sensation or choking  2.  OSTEOPENIA: She thinks she was told to have osteoporosis but review of her bone density indicates that she only has osteopenia with lowest T score -1.8 at the femoral neck which was 9.6% below previous level She has only been on calcium and vitamin D and is concerned about osteoporosis is being caused by PPI drugs She is not able to reduce her PPI drug because of reflux symptoms She is asking about potential treatment, was told by her PCP not to start any medications as yet  3.  IMPAIRED fasting glucose: She had a glucose level of 111 on a morning lab work, not clear if this was fasting. No A1c available, she is concerned about this  4.  Previous diagnosis of HYPOTHYROIDISM: This was was first diagnosed in 1969 with symptoms of fatigue, depression and weight gain Complaints are reported by the patient now are none following: Unusual fatigue, unexpected weight gain, cold sensitivity, difficulty concentrating , dry skin, hair loss.           The treatments that the patient has taken include Synthroid brand name.   In 07/2012 her TSH was over 5 and the dose was increased to 112 mcg.  TSH was normal subsequently  and she has not required any changes in her dose  Compliance with the medical regimen has been as prescribed with taking the tablet in the morning before breakfast. She does take her  calcium with her breakfast, previously was taking it later in the day and takes her thyroid supplement just before eating breakfast. However her TSH is fairly stable  Lab Results  Component Value Date   FREET4 0.99 05/30/2014   FREET4 0.94 11/02/2013   FREET4 1.05 04/20/2013   TSH 1.88 11/21/2014   TSH 2.88 07/19/2014   TSH 3.04 05/30/2014        Medication List       This list is accurate as of: 01/31/15 11:59 PM.  Always use your most recent med list.               aspirin 81 MG tablet  Take 81 mg by mouth daily.     busPIRone 15 MG tablet  Commonly known as:  BUSPAR  Take 1 tablet (15 mg total) by mouth 2 (two) times daily.     CALCIUM 500+D 500-200 MG-UNIT per tablet  Generic drug:  calcium-vitamin D  Take 1 tablet by mouth daily.     cetirizine 10 MG tablet  Commonly known as:  ZYRTEC  Take 10 mg by mouth as needed.     Cholecalciferol 50000 UNITS Tabs  Take 1 tablet by mouth once a week.     folic acid 1 MG tablet  Commonly known as:  FOLVITE  Take 1 mg by mouth daily.     meloxicam 15 MG tablet  Commonly  known as:  MOBIC  TAKE 1 TABLET BY MOUTH DAILY.     mometasone 50 MCG/ACT nasal spray  Commonly known as:  NASONEX  Place 4 sprays into the nose daily.     multivitamin capsule  Take 1 capsule by mouth daily.     ranitidine 150 MG tablet  Commonly known as:  ZANTAC  Take 150 mg by mouth 2 (two) times daily.     STELARA 90 MG/ML Sosy  Generic drug:  Ustekinumab  Inject 90 mcg into the skin. Every three months     SYNTHROID 112 MCG tablet  Generic drug:  levothyroxine  Take 1 tablet (112 mcg total) by mouth daily before breakfast.     VALTREX PO  Take 1 g by mouth as needed. Take 1 gram daily     VECTICAL 3 MCG/GM cream  Generic drug:  Calcitriol  Apply topically at bedtime.     vitamin E 200 UNIT capsule  Commonly known as:  vitamin E  Take 4 capsules (800 Units total) by mouth daily.     VOLTAREN 1 % Gel  Generic drug:   diclofenac sodium  Apply 1 application topically as needed.        Allergies:  Allergies  Allergen Reactions  . Erythromycin   . Nabumetone     Past Medical History  Diagnosis Date  . Hypothyroidism   . Osteoporosis   . Rosacea   . Granuloma annulare   . Psoriasis   . Dyslipidemia   . Multinodular goiter   . Fibromyalgia   . Anxiety   . Diverticulosis of colon (without mention of hemorrhage)   . Family history of malignant neoplasm of gastrointestinal tract   . Snores   . GERD (gastroesophageal reflux disease)   . Arthritis   . Diverticulosis   . Hiatal hernia     Past Surgical History  Procedure Laterality Date  . Tubal ligation    . Knee surgery      left  . Tonsillectomy    . Upper gastrointestinal endoscopy  2013    esoph dilation  . Colonoscopy  2013  . Mass excision  02/27/2012    Procedure: EXCISION MASS;  Surgeon: Schuyler Amor, MD;  Location: [facility];  Service: Orthopedics;  Laterality: Left;  excision of left forearm/wrist mass  . Wrist surgery      Family History  Problem Relation Age of Onset  . Colon cancer Father   . Cancer Father     colon  . Diverticulitis Mother   . Heart disease Mother   . Prostate cancer Maternal Uncle   . Breast cancer      great aunt  . Diabetes Paternal Grandfather   . Colon cancer Paternal Grandmother     Social History:  reports that she has never smoked. She has never used smokeless tobacco. She reports that she does not drink alcohol or use illicit drugs.  REVIEW Of SYSTEMS:  History of chronic reflux and also occasional swallowing difficulty  No recent leg edema     Examination:   There were no vitals taken for this visit.  She looks well      NECK: No palpable nodule on the right side. Left-sided nodule just palpable on swallowing No lymphadenopathy in the neck        NEUROLOGIC EXAM: Biceps reflexes show normal relaxation No lower leg edema    Assessments/Plan:   1.   History of thyroid nodule: Reassured her that  her exam is unchanged and she does not have any significant thyroid nodule or any growth in the previous left-sided nodule on exam.  She probably has swallowing difficulties that are unrelated and may be possibly related to her reflux No indication to repeat ultrasound at this time.  Previously had a benign left-sided nodule  2.  History of hypothyroidism: Her TSH was normal in June and she can continue the same doses  3.  She has concerns about her progressive osteopenia and treatment.  Discussed that if her bone density has decreased she does need to intervene with taking pharmacological therapy to reverse the process and prevent further loss and also reduce risk for fractures down the road.  She may be a candidate either for taking Evista or taking Reclast every 2 years She will also try to follow instructions given by PCP to optimize her vitamin D treatment and continue calcium  4.  Impaired fasting glucose: This is appearing to be stable at 111.  However discussed that we should check an A1c on her next labs and also consider glucose tolerance test if this is borderline Emphasized the need for increasing upper still lose weight Potentially may need to be on metformin if A1c is high normal or she also has documented glucose intolerance  Counseling time on subjects discussed above is over 50% of today's 25 minute visit  Durante Violett 02/01/2015, 10:22 AM

## 2015-02-01 ENCOUNTER — Encounter: Payer: Self-pay | Admitting: Endocrinology

## 2015-02-01 NOTE — Addendum Note (Signed)
Addended by: Roxanna Mew on: 02/01/2015 10:50 AM   Modules accepted: Medications

## 2015-02-14 ENCOUNTER — Encounter: Payer: Self-pay | Admitting: Internal Medicine

## 2015-02-28 ENCOUNTER — Encounter: Payer: Self-pay | Admitting: Gastroenterology

## 2015-03-02 ENCOUNTER — Ambulatory Visit: Payer: 59

## 2015-03-08 ENCOUNTER — Ambulatory Visit: Payer: 59

## 2015-03-08 ENCOUNTER — Ambulatory Visit (INDEPENDENT_AMBULATORY_CARE_PROVIDER_SITE_OTHER): Payer: 59

## 2015-03-08 DIAGNOSIS — Z23 Encounter for immunization: Secondary | ICD-10-CM | POA: Diagnosis not present

## 2015-03-26 ENCOUNTER — Other Ambulatory Visit: Payer: Self-pay | Admitting: Internal Medicine

## 2015-03-28 ENCOUNTER — Ambulatory Visit (INDEPENDENT_AMBULATORY_CARE_PROVIDER_SITE_OTHER): Payer: 59 | Admitting: Family Medicine

## 2015-03-28 ENCOUNTER — Encounter: Payer: Self-pay | Admitting: Family Medicine

## 2015-03-28 VITALS — BP 132/74 | HR 69 | Ht 69.0 in | Wt 238.0 lb

## 2015-03-28 DIAGNOSIS — M216X2 Other acquired deformities of left foot: Secondary | ICD-10-CM | POA: Diagnosis not present

## 2015-03-28 NOTE — Progress Notes (Signed)
Pre visit review using our clinic review tool, if applicable. No additional management support is needed unless otherwise documented below in the visit note. 

## 2015-03-28 NOTE — Patient Instructions (Signed)
Good to see you Lots of changes.  Ice is your friend  Lace shoes without the last eye [employer] orthotics "total support"  Online.  Continue the vitamin D  Sorry about your husband.  New exercises 3 times a week.  See me again in 4-6 weeks if not better.

## 2015-03-28 NOTE — Assessment & Plan Note (Signed)
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 We discussed with patient about metatarsal pads, wider more rigid shoes, icing protocol as well as topical anti-inflammatories. Patient will continue on high-dose vitamin D supplementation. No signs of a stress fracture or neuroma at this point we discussed that this could be a possibility if we do not help with healing now. Patient is taking care of her husband who recently injured his ankle and likely will need surgery. Patient is also going to be moving  soon so she is concerned she needs to continue walking. Patient come back again in 3 weeks for further evaluation and treatment. Spent  25 minutes with patient face-to-face and had greater than 50% of counseling including as described above in assessment and plan.

## 2015-03-28 NOTE — Progress Notes (Signed)
[name] [address] [place], [place] [address] Phone: [phone] Subjective:      CC: left foot pain  YDX:AJOINOMVEH [name] [name] is a 64 y.o. female coming in with complaint of left foot pain. Patient discussed the pain is more of a dull, throbbing aching pain. Patient states that it seems to be worse when she is walking on it. Sometimes can have a throbbing sensation at night. Patient has tried changing shoes with no significant improvement. Does not remember any injury. Is taking high-dose vitamin D supplementation. Has not been doing any other home modalities.   Past Medical History  Diagnosis Date  . Hypothyroidism   . Osteoporosis   . Rosacea   . Granuloma annulare   . Psoriasis   . Dyslipidemia   . Multinodular goiter   . Fibromyalgia   . Anxiety   . Diverticulosis of colon (without mention of hemorrhage)   . Family history of malignant neoplasm of gastrointestinal tract   . Snores   . GERD (gastroesophageal reflux disease)   . Arthritis   . Diverticulosis   . Hiatal hernia    Past Surgical History  Procedure Laterality Date  . Tubal ligation    . Knee surgery      left  . Tonsillectomy    . Upper gastrointestinal endoscopy  2013    esoph dilation  . Colonoscopy  2013  . Mass excision  02/27/2012    Procedure: EXCISION MASS;  Surgeon: Schuyler Amor, MD;  Location: [facility];  Service: Orthopedics;  Laterality: Left;  excision of left forearm/wrist mass  . Wrist surgery     Social History  Substance Use Topics  . Smoking status: Never Smoker   . Smokeless tobacco: Never Used  . Alcohol Use: No     Comment: wine, beer, mixed drinks on weekends   Allergies  Allergen Reactions  . Erythromycin   . Nabumetone    Family History  Problem Relation Age of Onset  . Colon cancer Father   . Cancer Father     colon  . Diverticulitis Mother   . Heart disease Mother   . Prostate cancer Maternal Uncle   .  Breast cancer      great aunt  . Diabetes Paternal Grandfather   . Colon cancer Paternal Grandmother         Past medical history, social, surgical and family history all reviewed in electronic medical record.   Review of Systems: No headache, visual changes, nausea, vomiting, diarrhea, constipation, dizziness, abdominal pain, skin rash, fevers, chills, night sweats, weight loss, swollen lymph nodes, body aches, joint swelling, muscle aches, chest pain, shortness of breath, mood changes.   Objective Blood pressure 132/74, pulse 69, height 5\' 9"  (1.753 m), weight 238 lb (107.956 kg), SpO2 96 %.  General: No apparent distress alert and oriented x3 mood and affect normal, dressed appropriately.  HEENT: Pupils equal, extraocular movements intact  Respiratory: Patient's speak in full sentences and does not appear short of breath  Cardiovascular: No lower extremity edema, non tender, no erythema  Skin: Warm dry intact with no signs of infection or rash on extremities or on axial skeleton.  Abdomen: Soft nontender  Neuro: Cranial nerves II through XII are intact, neurovascularly intact in all extremities with 2+ DTRs and 2+ pulses.  Lymph: No lymphadenopathy of posterior or anterior cervical chain or axillae bilaterally.  Gait normal with good balance and coordination.  MSK:  Non  tender with full range of motion and good stability and symmetric strength and tone of shoulders, elbows, wrist, hip, knee and ankles bilaterally.  Foot exam shows the patient does have significant loss of the transverse arch left greater than right. Patient does have callus remission under the second and third metatarsal heads. No significant skin breakdown. Mild tenderness to palpation over the dorsal aspect of the foot as well as over the sesamoid bones. Negative compression pain. Neurovascular intact distally. Full range of motion of the ankle. Contralateral foot also has mild breakdown of the transverse arch but  nontender on exam.   Impression and Recommendations:     This case required medical decision making of moderate complexity.

## 2015-04-05 ENCOUNTER — Ambulatory Visit (INDEPENDENT_AMBULATORY_CARE_PROVIDER_SITE_OTHER): Payer: 59 | Admitting: Internal Medicine

## 2015-04-05 ENCOUNTER — Encounter: Payer: Self-pay | Admitting: Internal Medicine

## 2015-04-05 VITALS — BP 120/78 | HR 70 | Temp 97.0°F | Resp 16 | Ht 69.0 in | Wt 240.0 lb

## 2015-04-05 DIAGNOSIS — K219 Gastro-esophageal reflux disease without esophagitis: Secondary | ICD-10-CM

## 2015-04-05 MED ORDER — OMEPRAZOLE 20 MG PO CPDR: 20.0000 mg | DELAYED_RELEASE_CAPSULE | Freq: Every day | ORAL | Status: AC

## 2015-04-05 NOTE — Patient Instructions (Signed)

## 2015-04-05 NOTE — Progress Notes (Signed)
Subjective:  Patient ID: [name] [name], female    DOB: 28-Oct-1950  Age: 64 y.o. MRN: [id number]  CC: Gastroesophageal Reflux   HPI [name] [name] presents for a refill on her PPI. She feels well and offers no complaints.  Outpatient Prescriptions Prior to Visit  Medication Sig Dispense Refill  . aspirin 81 MG tablet Take 81 mg by mouth daily.      . busPIRone (BUSPAR) 15 MG tablet Take 1 tablet (15 mg total) by mouth 2 (two) times daily. 180 tablet 3  . Calcitriol (VECTICAL) 3 MCG/GM cream Apply topically at bedtime.    . calcium-vitamin D (CALCIUM 500+D) 500-200 MG-UNIT per tablet Take 1 tablet by mouth daily.      . cetirizine (ZYRTEC) 10 MG tablet Take 10 mg by mouth as needed.    . Cholecalciferol 50000 UNITS TABS Take 1 tablet by mouth once a week. 12 tablet 3  . diclofenac sodium (VOLTAREN) 1 % GEL Apply 1 application topically as needed.    . folic acid (FOLVITE) 1 MG tablet Take 1 mg by mouth daily.      . meloxicam (MOBIC) 15 MG tablet TAKE 1 TABLET BY MOUTH DAILY. 30 tablet 3  . mometasone (NASONEX) 50 MCG/ACT nasal spray Place 4 sprays into the nose daily. 17 g 12  . Multiple Vitamin (MULTIVITAMIN) capsule Take 1 capsule by mouth daily.      Marland Kitchen SYNTHROID 112 MCG tablet Take 1 tablet (112 mcg total) by mouth daily before breakfast. 90 tablet 3  . Ustekinumab (STELARA) 90 MG/ML SOSY Inject 90 mcg into the skin. Every three months    . ValACYclovir HCl (VALTREX PO) Take 1 g by mouth as needed. Take 1 gram daily    . vitamin E (VITAMIN E) 200 UNIT capsule Take 4 capsules (800 Units total) by mouth daily.    . ranitidine (ZANTAC) 150 MG tablet Take 150 mg by mouth 2 (two) times daily.     No facility-administered medications prior to visit.    ROS Review of Systems  Constitutional: Negative.  Negative for fever, chills, diaphoresis, appetite change and fatigue.  HENT: Negative.  Negative for trouble swallowing and voice change.   Eyes: Negative.   Respiratory: Negative.   Negative for cough, choking, shortness of breath and stridor.   Cardiovascular: Negative.  Negative for chest pain, palpitations and leg swelling.  Gastrointestinal: Negative.  Negative for nausea, vomiting, abdominal pain, diarrhea, constipation and blood in stool.  Endocrine: Negative.   Genitourinary: Negative.   Musculoskeletal: Negative.  Negative for back pain and arthralgias.  Skin: Negative.  Negative for rash.  Allergic/Immunologic: Negative.   Neurological: Negative.   Hematological: Negative.  Negative for adenopathy. Does not bruise/bleed easily.  Psychiatric/Behavioral: Negative.     Objective:  BP 120/78 mmHg  Pulse 70  Temp(Src) 97 F (36.1 C) (Oral)  Resp 16  Ht 5\' 9"  (1.753 m)  Wt 240 lb (108.863 kg)  BMI 35.43 kg/m2  SpO2 96%  BP Readings from Last 3 Encounters:  04/05/15 120/78  03/28/15 132/74  02/01/15 126/82    Wt Readings from Last 3 Encounters:  04/05/15 240 lb (108.863 kg)  03/28/15 238 lb (107.956 kg)  02/01/15 239 lb 3.2 oz (108.5 kg)    Physical Exam  Constitutional: She is oriented to person, place, and time. No distress.  HENT:  Mouth/Throat: Oropharynx is clear and moist. No oropharyngeal exudate.  Eyes: Conjunctivae are normal. Right eye exhibits no discharge. Left eye exhibits  no discharge. No scleral icterus.  Neck: Normal range of motion. Neck supple. No JVD present. No tracheal deviation present. No thyromegaly present.  Cardiovascular: Normal rate, regular rhythm, normal heart sounds and intact distal pulses.  Exam reveals no gallop and no friction rub.   No murmur heard. Pulmonary/Chest: Effort normal and breath sounds normal. No stridor. No respiratory distress. She has no wheezes. She has no rales. She exhibits no tenderness.  Abdominal: Soft. Bowel sounds are normal. She exhibits no distension and no mass. There is no tenderness. There is no rebound and no guarding.  Musculoskeletal: Normal range of motion. She exhibits no edema  or tenderness.  Lymphadenopathy:    She has no cervical adenopathy.  Neurological: She is oriented to person, place, and time.  Skin: Skin is warm and dry. No rash noted. She is not diaphoretic. No erythema. No pallor.  Vitals reviewed.   Lab Results  Component Value Date   WBC 7.4 11/21/2014   HGB 15.2* 11/21/2014   HCT 45.8 11/21/2014   PLT 277.0 11/21/2014   GLUCOSE 111* 11/21/2014   CHOL 200 07/19/2014   TRIG 102.0 07/19/2014   HDL 49.30 07/19/2014   LDLCALC 130* 07/19/2014   ALT 18 11/21/2014   AST 24 11/21/2014   NA 139 11/21/2014   K 4.3 11/21/2014   CL 105 11/21/2014   CREATININE 0.79 11/21/2014   BUN 13 11/21/2014   CO2 28 11/21/2014   TSH 1.88 11/21/2014   INR 1.0 ratio 06/21/2009    Dg Bone Density  11/27/2014  Date of study: 11/21/14 Exam: DUAL X-RAY ABSORPTIOMETRY (DXA) FOR BONE MINERAL DENSITY (BMD) Instrument: [employer] Provider: PCP Indication: follow up for low BMD Comparison: none (please note that it is not possible to compare data from different instruments) Clinical data: Pt is a postmenopausal 64 y.o. female with previous h/o fracture. On calcium and vitamin D. Results:  Lumbar spine (L1-L4) Femoral neck (FN) 33% distal radius T-score 1.4 RFN:-1.5 LFN:-1.8 n/a Change in BMD from previous DXA test (%) Up 0.9 % Down 9.6% n/a (*) statistically significant Assessment: the BMD is low according to the [facility] classification for osteoporosis (see below). Fracture risk: moderate FRAX score: 10 year major osteoporotic risk: 14.7%. 10 year hip fracture risk: 1.7%. These are under the thresholds for treatment of 20% and 3%, respectively. Comments: the technical quality of the study is good. Evaluation for secondary causes should be considered if clinically indicated. Recommend optimizing calcium (1200 mg/day) and vitamin D (800 IU/day) intake. Followup: Repeat BMD is appropriate after 2 years or after 1-2 years if starting treatment. WHO criteria for  diagnosis of osteoporosis in postmenopausal women and in men 36 y/o or older: - normal: T-score -1.0 to + 1.0 - osteopenia/low bone density: T-score between -2.5 and -1.0 - osteoporosis: T-score below -2.5 - severe osteoporosis: T-score below -2.5 with history of fragility fracture Note: although not part of the WHO classification, the presence of a fragility fracture, regardless of the T-score, should be considered diagnostic of osteoporosis, provided other causes for the fracture have been excluded. Treatment: The National Osteoporosis Foundation recommends that treatment be considered in postmenopausal women and men age 88 or older with: 1. Hip or vertebral (clinical or morphometric) fracture 2. T-score of - 2.5 or lower at the spine or hip 3. 10-year fracture probability by FRAX of at least 20% for a major osteoporotic fracture and 3% for a hip fracture Loura Pardon MD    Assessment & Plan:  [name] [name] was seen today for gastroesophageal reflux.  Diagnoses and all orders for this visit:  Gastroesophageal reflux disease without esophagitis- cont the PPI -     omeprazole (PRILOSEC) 20 MG capsule; Take 1 capsule (20 mg total) by mouth daily.  I have discontinued Ms. Mura's ranitidine. I am also having her maintain her aspirin, calcium-vitamin D, folic acid, multivitamin, ValACYclovir HCl (VALTREX PO), Calcitriol, diclofenac sodium, cetirizine, ustekinumab, vitamin E, SYNTHROID, mometasone, meloxicam, busPIRone, Cholecalciferol, cyclobenzaprine, and omeprazole.  Meds ordered this encounter  Medications  . cyclobenzaprine (FLEXERIL) 10 MG tablet    Sig:     Refill:  1  . omeprazole (PRILOSEC) 20 MG capsule    Sig: Take 1 capsule (20 mg total) by mouth daily.    Dispense:  90 capsule    Refill:  3     Follow-up: Return if symptoms worsen or fail to improve.  Scarlette Calico, MD

## 2015-05-09 LAB — CBC AND DIFFERENTIAL
HEMATOCRIT: 42 % (ref 36–46)
Hemoglobin: 13.9 g/dL (ref 12.0–16.0)
PLATELETS: 319 10*3/uL (ref 150–399)
WBC: 6.9 10^3/mL

## 2015-05-09 LAB — BASIC METABOLIC PANEL
BUN: 14 mg/dL (ref 4–21)
CREATININE: 0.7 mg/dL (ref 0.5–1.1)
Glucose: 93 mg/dL
Potassium: 4.4 mmol/L (ref 3.4–5.3)
SODIUM: 139 mmol/L (ref 137–147)

## 2015-05-09 LAB — HEPATIC FUNCTION PANEL
ALK PHOS: 72 U/L (ref 25–125)
ALT: 20 U/L (ref 7–35)
AST: 25 U/L (ref 13–35)

## 2015-05-17 ENCOUNTER — Encounter: Payer: Self-pay | Admitting: Cardiology

## 2015-05-23 ENCOUNTER — Ambulatory Visit: Payer: 59 | Admitting: Internal Medicine

## 2015-05-28 ENCOUNTER — Encounter: Payer: Self-pay | Admitting: Internal Medicine

## 2015-05-28 ENCOUNTER — Other Ambulatory Visit (INDEPENDENT_AMBULATORY_CARE_PROVIDER_SITE_OTHER): Payer: 59

## 2015-05-28 ENCOUNTER — Ambulatory Visit (INDEPENDENT_AMBULATORY_CARE_PROVIDER_SITE_OTHER): Payer: 59 | Admitting: Internal Medicine

## 2015-05-28 VITALS — BP 110/80 | HR 67 | Temp 97.5°F | Resp 16 | Ht 69.0 in | Wt 234.0 lb

## 2015-05-28 DIAGNOSIS — E785 Hyperlipidemia, unspecified: Secondary | ICD-10-CM

## 2015-05-28 DIAGNOSIS — E559 Vitamin D deficiency, unspecified: Secondary | ICD-10-CM | POA: Diagnosis not present

## 2015-05-28 DIAGNOSIS — E038 Other specified hypothyroidism: Secondary | ICD-10-CM | POA: Diagnosis not present

## 2015-05-28 LAB — LIPID PANEL
CHOL/HDL RATIO: 4
Cholesterol: 164 mg/dL (ref 0–200)
HDL: 46.8 mg/dL (ref >39.00)
LDL CALC: 103 mg/dL — AB (ref 0–99)
NonHDL: 117.17
Triglycerides: 72 mg/dL (ref 0.0–149.0)
VLDL: 14.4 mg/dL (ref 0.0–40.0)

## 2015-05-28 LAB — VITAMIN D 25 HYDROXY (VIT D DEFICIENCY, FRACTURES): VITD: 50.52 ng/mL (ref 30.00–100.00)

## 2015-05-28 LAB — TSH: TSH: 3.26 u[IU]/mL (ref 0.35–4.50)

## 2015-05-28 NOTE — Patient Instructions (Signed)

## 2015-05-28 NOTE — Progress Notes (Signed)
Pre visit review using our clinic review tool, if applicable. No additional management support is needed unless otherwise documented below in the visit note. 

## 2015-05-28 NOTE — Progress Notes (Signed)
Subjective:  Patient ID: Sherral Hammers, female    DOB: July 23, 1950  Age: 64 y.o. MRN: [id number]  CC: Hypothyroidism and Hyperlipidemia   HPI JAMAL ANTROBUS presents for a follow-up.  She feels well and offers no new complaints.  Outpatient Prescriptions Prior to Visit  Medication Sig Dispense Refill  . aspirin 81 MG tablet Take 81 mg by mouth daily.      . busPIRone (BUSPAR) 15 MG tablet Take 1 tablet (15 mg total) by mouth 2 (two) times daily. 180 tablet 3  . Calcitriol (VECTICAL) 3 MCG/GM cream Apply topically at bedtime.    . calcium-vitamin D (CALCIUM 500+D) 500-200 MG-UNIT per tablet Take 1 tablet by mouth daily.      . cetirizine (ZYRTEC) 10 MG tablet Take 10 mg by mouth as needed.    . Cholecalciferol 50000 UNITS TABS Take 1 tablet by mouth once a week. 12 tablet 3  . cyclobenzaprine (FLEXERIL) 10 MG tablet   1  . diclofenac sodium (VOLTAREN) 1 % GEL Apply 1 application topically as needed.    . folic acid (FOLVITE) 1 MG tablet Take 1 mg by mouth daily.      . meloxicam (MOBIC) 15 MG tablet TAKE 1 TABLET BY MOUTH DAILY. 30 tablet 3  . mometasone (NASONEX) 50 MCG/ACT nasal spray Place 4 sprays into the nose daily. 17 g 12  . Multiple Vitamin (MULTIVITAMIN) capsule Take 1 capsule by mouth daily.      Marland Kitchen omeprazole (PRILOSEC) 20 MG capsule Take 1 capsule (20 mg total) by mouth daily. 90 capsule 3  . SYNTHROID 112 MCG tablet Take 1 tablet (112 mcg total) by mouth daily before breakfast. 90 tablet 3  . Ustekinumab (STELARA) 90 MG/ML SOSY Inject 90 mcg into the skin. Every three months    . ValACYclovir HCl (VALTREX PO) Take 1 g by mouth as needed. Take 1 gram daily    . vitamin E (VITAMIN E) 200 UNIT capsule Take 4 capsules (800 Units total) by mouth daily.     No facility-administered medications prior to visit.    ROS Review of Systems  Constitutional: Negative.  Negative for fever, chills, diaphoresis, appetite change and fatigue.  HENT: Negative.   Eyes: Negative.     Respiratory: Negative.  Negative for cough, choking, chest tightness, shortness of breath, wheezing and stridor.   Cardiovascular: Positive for leg swelling. Negative for chest pain and palpitations.       She has had chronic, pitting edema in her lower legs since her teenage years.  Gastrointestinal: Negative.  Negative for nausea, vomiting, abdominal pain, diarrhea, constipation and blood in stool.  Endocrine: Negative.   Genitourinary: Negative.  Negative for dysuria, frequency, hematuria, flank pain and difficulty urinating.  Musculoskeletal: Negative.   Skin: Negative.   Allergic/Immunologic: Negative.   Neurological: Negative.  Negative for dizziness, weakness, light-headedness and headaches.  Hematological: Negative.  Negative for adenopathy. Does not bruise/bleed easily.  Psychiatric/Behavioral: Negative.     Objective:  BP 110/80 mmHg  Pulse 67  Temp(Src) 97.5 F (36.4 C) (Oral)  Resp 16  Ht 5\' 9"  (1.753 m)  Wt 234 lb (106.142 kg)  BMI 34.54 kg/m2  SpO2 97%  BP Readings from Last 3 Encounters:  05/28/15 110/80  04/05/15 120/78  03/28/15 132/74    Wt Readings from Last 3 Encounters:  05/28/15 234 lb (106.142 kg)  04/05/15 240 lb (108.863 kg)  03/28/15 238 lb (107.956 kg)    Physical Exam  Constitutional: She is  oriented to person, place, and time. No distress.  HENT:  Head: Normocephalic and atraumatic.  Mouth/Throat: Oropharynx is clear and moist. No oropharyngeal exudate.  Eyes: Conjunctivae are normal. Right eye exhibits no discharge. No scleral icterus.  Neck: Normal range of motion. Neck supple. No JVD present. No tracheal deviation present. No thyromegaly present.  Cardiovascular: Normal rate, regular rhythm and intact distal pulses.  Exam reveals no gallop and no friction rub.   No murmur heard. Pulmonary/Chest: Effort normal and breath sounds normal. No stridor. No respiratory distress. She has no wheezes. She has no rales. She exhibits no tenderness.   Abdominal: Soft. Bowel sounds are normal. She exhibits no distension and no mass. There is no tenderness. There is no rebound and no guarding.  Musculoskeletal: Normal range of motion. She exhibits edema (1+ pitting edema in BLE). She exhibits no tenderness.  Lymphadenopathy:    She has no cervical adenopathy.  Neurological: She is oriented to person, place, and time.  Skin: Skin is warm and dry. No rash noted. She is not diaphoretic. No erythema. No pallor.  Vitals reviewed.   Lab Results  Component Value Date   WBC 6.9 05/09/2015   HGB 13.9 05/09/2015   HCT 42 05/09/2015   PLT 319 05/09/2015   GLUCOSE 111* 11/21/2014   CHOL 164 05/28/2015   TRIG 72.0 05/28/2015   HDL 46.80 05/28/2015   LDLCALC 103* 05/28/2015   ALT 20 05/09/2015   AST 25 05/09/2015   NA 139 05/09/2015   K 4.4 05/09/2015   CL 105 11/21/2014   CREATININE 0.7 05/09/2015   BUN 14 05/09/2015   CO2 28 11/21/2014   TSH 3.26 05/28/2015   INR 1.0 ratio 06/21/2009    Dg Bone Density  11/27/2014  Date of study: 11/21/14 Exam: DUAL X-RAY ABSORPTIOMETRY (DXA) FOR BONE MINERAL DENSITY (BMD) Instrument: [employer] Provider: PCP Indication: follow up for low BMD Comparison: none (please note that it is not possible to compare data from different instruments) Clinical data: Pt is a postmenopausal 64 y.o. female with previous h/o fracture. On calcium and vitamin D. Results:  Lumbar spine (L1-L4) Femoral neck (FN) 33% distal radius T-score 1.4 RFN:-1.5 LFN:-1.8 n/a Change in BMD from previous DXA test (%) Up 0.9 % Down 9.6% n/a (*) statistically significant Assessment: the BMD is low according to the [facility] classification for osteoporosis (see below). Fracture risk: moderate FRAX score: 10 year major osteoporotic risk: 14.7%. 10 year hip fracture risk: 1.7%. These are under the thresholds for treatment of 20% and 3%, respectively. Comments: the technical quality of the study is good. Evaluation for secondary causes  should be considered if clinically indicated. Recommend optimizing calcium (1200 mg/day) and vitamin D (800 IU/day) intake. Followup: Repeat BMD is appropriate after 2 years or after 1-2 years if starting treatment. WHO criteria for diagnosis of osteoporosis in postmenopausal women and in men 63 y/o or older: - normal: T-score -1.0 to + 1.0 - osteopenia/low bone density: T-score between -2.5 and -1.0 - osteoporosis: T-score below -2.5 - severe osteoporosis: T-score below -2.5 with history of fragility fracture Note: although not part of the WHO classification, the presence of a fragility fracture, regardless of the T-score, should be considered diagnostic of osteoporosis, provided other causes for the fracture have been excluded. Treatment: The National Osteoporosis Foundation recommends that treatment be considered in postmenopausal women and men age 67 or older with: 1. Hip or vertebral (clinical or morphometric) fracture 2. T-score of - 2.5 or lower  at the spine or hip 3. 10-year fracture probability by FRAX of at least 20% for a major osteoporotic fracture and 3% for a hip fracture [place]:   Daijia was seen today for hypothyroidism and hyperlipidemia.  Diagnoses and all orders for this visit:  Vitamin D deficiency -     VITAMIN D 25 Hydroxy (Vit-D Deficiency, Fractures); Future  Other specified hypothyroidism- her Tsh is in the normal range -     Lipid panel; Future -     TSH; Future  Hyperlipidemia with target LDL less than 160- Framingham risk score is 2% therefore I do not recommend that she start a statin -     Lipid panel; Future -     TSH; Future   I am having Ms. Fleurant maintain her aspirin, calcium-vitamin D, folic acid, multivitamin, ValACYclovir HCl (VALTREX PO), Calcitriol, diclofenac sodium, cetirizine, ustekinumab, vitamin E, SYNTHROID, mometasone, meloxicam, busPIRone, Cholecalciferol, cyclobenzaprine, and omeprazole.  No orders of the defined  types were placed in this encounter.     Follow-up: Return in about 6 months (around 11/26/2015).  Scarlette Calico, MD

## 2015-06-07 ENCOUNTER — Other Ambulatory Visit: Payer: Self-pay | Admitting: Family Medicine

## 2015-06-07 NOTE — Telephone Encounter (Signed)
Refill done.  

## 2015-06-19 ENCOUNTER — Encounter: Payer: Self-pay | Admitting: Gastroenterology

## 2015-07-02 ENCOUNTER — Other Ambulatory Visit: Payer: Self-pay | Admitting: Endocrinology

## 2015-07-06 MED FILL — OMEPRAZOLE DR 20 MG CAPSULE: 20 | 90 days supply | Qty: 90 | Fill #1 | Status: TO

## 2015-07-10 MED FILL — valACYclovir HCL 1 GM TABS: 1 | 4 days supply | Qty: 12 | Fill #0 | Status: TO

## 2015-07-12 MED FILL — SYNTHROID 112 MCG TABLET: 112 | 90 days supply | Qty: 90 | Fill #0 | Status: TO

## 2015-08-06 MED FILL — VIT D2 1.25 MG (50,000 UNIT: 1.25 MG | 84 days supply | Qty: 12 | Fill #2 | Status: TO

## 2015-08-06 MED FILL — STELARA 90 MG/ML SOSY: 90 | 30 days supply | Qty: 1 | Fill #2

## 2015-10-05 ENCOUNTER — Other Ambulatory Visit: Payer: Self-pay

## 2015-10-05 DIAGNOSIS — Z1231 Encounter for screening mammogram for malignant neoplasm of breast: Secondary | ICD-10-CM

## 2015-10-17 ENCOUNTER — Other Ambulatory Visit: Payer: Self-pay

## 2015-10-17 DIAGNOSIS — M810 Age-related osteoporosis without current pathological fracture: Secondary | ICD-10-CM

## 2015-10-17 DIAGNOSIS — E559 Vitamin D deficiency, unspecified: Secondary | ICD-10-CM

## 2015-10-17 MED ORDER — CHOLECALCIFEROL 1.25 MG (50000 UT) PO TABS: 1.0000 | ORAL_TABLET | ORAL | Status: AC

## 2015-10-17 MED ORDER — BUSPIRONE HCL 15 MG PO TABS: 15.0000 mg | ORAL_TABLET | Freq: Two times a day (BID) | ORAL | Status: AC

## 2015-10-17 NOTE — Telephone Encounter (Signed)
Ok to fill? PCP out of office

## 2015-11-09 ENCOUNTER — Ambulatory Visit: Payer: 59 | Admitting: Endocrinology

## 2015-11-09 ENCOUNTER — Ambulatory Visit
Admission: RE | Admit: 2015-11-09 | Discharge: 2015-11-09 | Disposition: A | Discharge status: Home / Self Care | Payer: BLUE CROSS/BLUE SHIELD | Source: Ambulatory Visit

## 2015-11-09 ENCOUNTER — Other Ambulatory Visit: Payer: Self-pay | Admitting: Internal Medicine

## 2015-11-09 DIAGNOSIS — Z1231 Encounter for screening mammogram for malignant neoplasm of breast: Secondary | ICD-10-CM

## 2015-11-12 LAB — HM MAMMOGRAPHY

## 2015-11-12 NOTE — Addendum Note (Signed)
Addended by: Janith Lima on: 11/12/2015 12:31 PM   Modules accepted: Miquel Dunn

## 2015-12-14 ENCOUNTER — Ambulatory Visit: Payer: 59 | Admitting: Endocrinology

## 2016-04-11 ENCOUNTER — Encounter: Payer: Self-pay | Admitting: Endocrinology

## 2016-04-11 ENCOUNTER — Ambulatory Visit (INDEPENDENT_AMBULATORY_CARE_PROVIDER_SITE_OTHER): Payer: BLUE CROSS/BLUE SHIELD | Admitting: Endocrinology

## 2016-04-11 VITALS — BP 114/70 | HR 65 | Ht 69.0 in | Wt 247.0 lb

## 2016-04-11 DIAGNOSIS — E038 Other specified hypothyroidism: Secondary | ICD-10-CM

## 2016-04-11 DIAGNOSIS — M8580 Other specified disorders of bone density and structure, unspecified site: Secondary | ICD-10-CM | POA: Diagnosis not present

## 2016-04-11 MED ORDER — RISEDRONATE SODIUM 150 MG PO TABS: 150.0000 mg | ORAL_TABLET | ORAL | 3 refills | Status: AC

## 2016-04-11 NOTE — Progress Notes (Signed)
[name] [name] 65 y.o.           Reason for Appointment:   followup visit   History of Present Illness:   1.  HYPOTHYROIDISM:   This was was first diagnosed in 1969 with symptoms of fatigue, depression and weight gain The treatments that the patient has taken include Synthroid brand name.  In 07/2012 her TSH was over 5 and the dose was increased to 112 mcg. She has been on the same dose since 10 Complaints are reported by the patient now are none following: Unusual fatigue, cold intolerance, dry skin          TSH was normal recently checked by PCP result was 1.73 done on 03/24/16  Compliance with the medical regimen has been as prescribed with taking the tablet in the morning before breakfast.  Lab Results  Component Value Date   TSH 3.26 05/28/2015   TSH 1.88 11/21/2014   TSH 2.88 07/19/2014   FREET4 0.99 05/30/2014   FREET4 0.94 11/02/2013   FREET4 1.05 04/20/2013    Also has history of left thyroid nodule, benign on biopsies twice, last in 2005  2.  OSTEOPENIA: She has osteopenia with lowest T score in 6/16 being -1.8 at the femoral neck which was 9.6% below previous level  She has only been on calcium and vitamin D  She is not able to reduce her PPI drug because of reflux symptoms She has had wrist fracture twice since her last visit this summer, currently not on any medications   3.  IMPAIRED fasting glucose: She had a glucose level of 111 previously and now recently was 104 A1c not checked      Medication List       Accurate as of 04/11/16  3:33 PM. Always use your most recent med list.          aspirin 81 MG tablet Take 81 mg by mouth daily.   busPIRone 15 MG tablet Commonly known as:  BUSPAR Take 1 tablet (15 mg total) by mouth 2 (two) times daily.   CALCIUM 500+D 500-200 MG-UNIT tablet Generic drug:  calcium-vitamin D Take 1 tablet by mouth daily.   cetirizine 10 MG tablet Commonly known as:  ZYRTEC Take 10 mg by mouth as needed.     Cholecalciferol 50000 units Tabs Take 1 tablet by mouth once a week.   clobetasol ointment 0.05 % Commonly known as:  TEMOVATE APPLY ON THE SKIN ONCE DAILY FOR TWO WEEKS THEN STOP   cyclobenzaprine 10 MG tablet Commonly known as:  FLEXERIL   folic acid 1 MG tablet Commonly known as:  FOLVITE Take 1 mg by mouth daily.   meloxicam 15 MG tablet Commonly known as:  MOBIC TAKE 1 TABLET BY MOUTH ONCE DAILY   mometasone 50 MCG/ACT nasal spray Commonly known as:  NASONEX Place 4 sprays into the nose daily.   multivitamin capsule Take 1 capsule by mouth daily.   omeprazole 20 MG capsule Commonly known as:  PRILOSEC Take 1 capsule (20 mg total) by mouth daily.   STELARA 90 MG/ML Sosy injection Generic drug:  ustekinumab Inject 90 mcg into the skin. Every three months   SYNTHROID 112 MCG tablet Generic drug:  levothyroxine TAKE 1 TABLET BY MOUTH DAILY BEFORE BREAKFAST.   VALTREX PO Take 1 g by mouth as needed. Take 1 gram daily   VECTICAL 3 MCG/GM cream Generic drug:  Calcitriol Apply topically at bedtime.   vitamin E 200 UNIT capsule Commonly  known as:  vitamin E Take 4 capsules (800 Units total) by mouth daily.   VOLTAREN 1 % Gel Generic drug:  diclofenac sodium Apply 1 application topically as needed.       Allergies:  Allergies  Allergen Reactions  . Duexis [Ibuprofen-Famotidine] Diarrhea  . Erythromycin   . Nabumetone     Past Medical History:  Diagnosis Date  . Anxiety   . Arthritis   . Diverticulosis   . Diverticulosis of colon (without mention of hemorrhage)   . Dyslipidemia   . Family history of malignant neoplasm of gastrointestinal tract   . Fibromyalgia   . GERD (gastroesophageal reflux disease)   . Granuloma annulare   . Hiatal hernia   . Hypothyroidism   . Multinodular goiter   . Osteoporosis   . Psoriasis   . Rosacea   . Snores     Past Surgical History:  Procedure Laterality Date  . COLONOSCOPY  2013  . KNEE SURGERY      left  . MASS EXCISION  02/27/2012   Procedure: EXCISION MASS;  Surgeon: Schuyler Amor, MD;  Location: [place];  Service: Orthopedics;  Laterality: Left;  excision of left forearm/wrist mass  . TONSILLECTOMY    . TUBAL LIGATION    . UPPER GASTROINTESTINAL ENDOSCOPY  2013   esoph dilation  . WRIST SURGERY      Family History  Problem Relation Age of Onset  . Colon cancer Father   . Cancer Father     colon  . Diverticulitis Mother   . Heart disease Mother   . Prostate cancer Maternal Uncle   . Breast cancer      great aunt  . Diabetes Paternal Grandfather   . Colon cancer Paternal Grandmother     Social History:  reports that she has never smoked. She has never used smokeless tobacco. She reports that she does not drink alcohol or use drugs.  REVIEW Of SYSTEMS:  History of chronic reflux      Examination:   BP 114/70   Pulse 65   Ht 5\' 9"  (1.753 m)   Wt 247 lb (112 kg)   SpO2 96%   BMI 36.48 kg/m   She looks well      NECK: No palpable nodules  NEUROLOGIC EXAM: Biceps reflexes show normal relaxation No  edema    Assessments/Plan:   1.  History of thyroid nodule: Previously had a benign left-sided nodule and is not palpable.  2.  History of hypothyroidism: Her TSH was normal in 10/17 with PCP and she can continue the same doses  3.  She has concerns about her progressive osteopenia and treatment.  More recently has had wrist fracture twice Recommended that she start a bisphosphonate and she agrees to try Actonel 150 mg monthly   4.  Impaired fasting glucose: Recent glucose 104 fasting, recommend that she have an A1c done when she goes back to her PCP   [facility] 04/11/2016, 3:33 PM

## 2016-04-11 NOTE — Patient Instructions (Signed)
Try Unithroid brand

## 2016-10-03 ENCOUNTER — Encounter: Payer: Self-pay | Admitting: *Deleted

## 2016-10-18 IMAGING — CR DG KNEE COMPLETE 4+V*R*
4 series · 4 of 4 positions shown · non-contrast
Comparison: Lateral and sunrise views of the right knee January 12, 2013

CLINICAL DATA: Twisting injury of the right knee 3 days ago with
significant pain and swelling

EXAM:
RIGHT KNEE - COMPLETE 4+ VIEW

[view not recorded (1 of 4)]
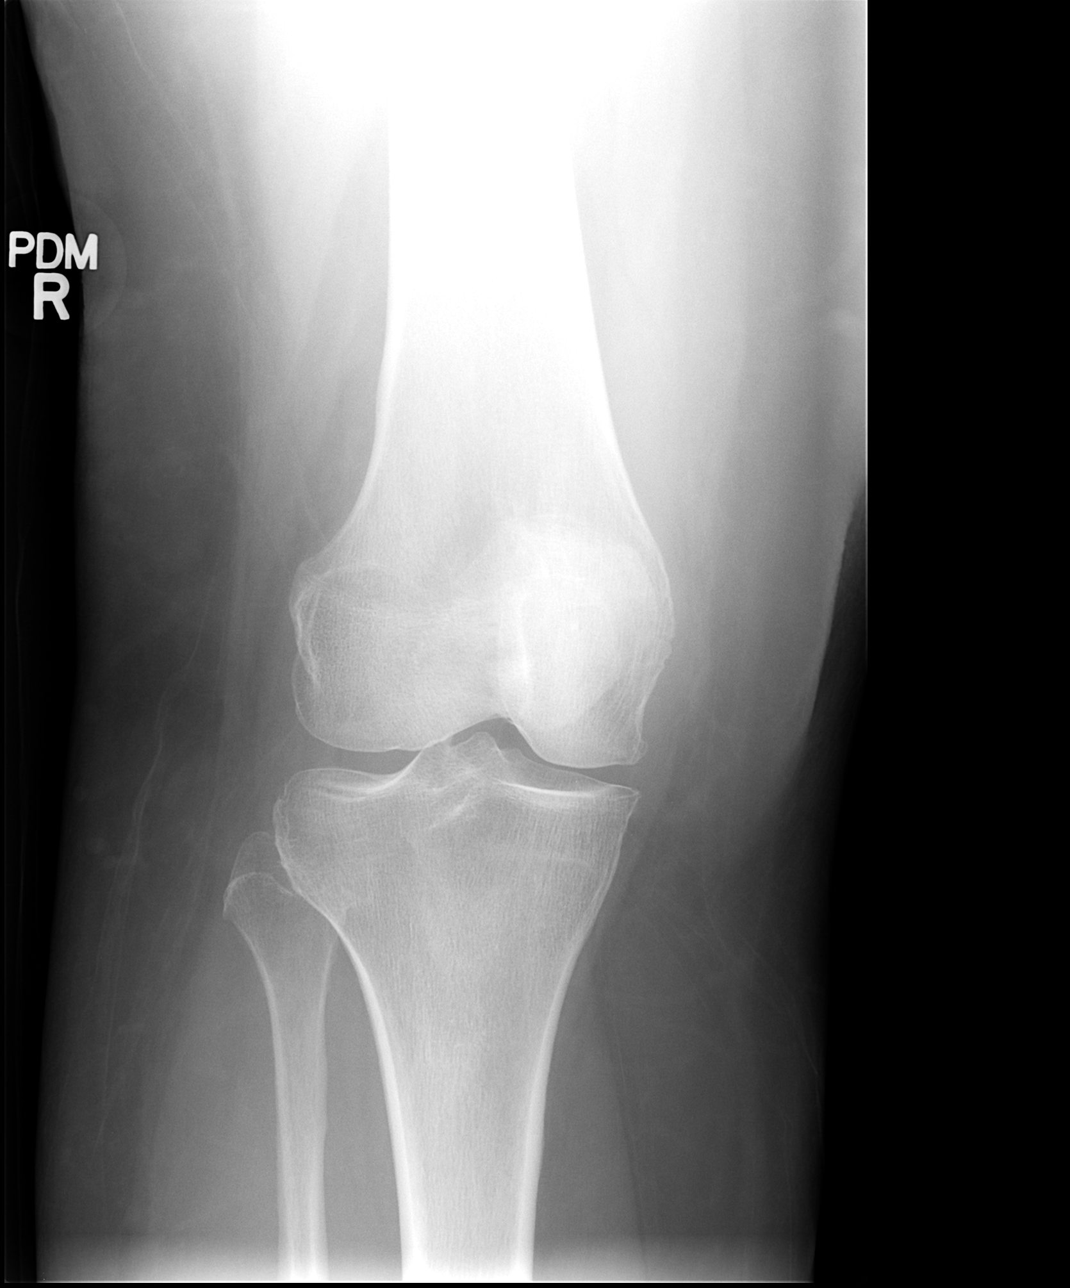

[view not recorded (2 of 4)]
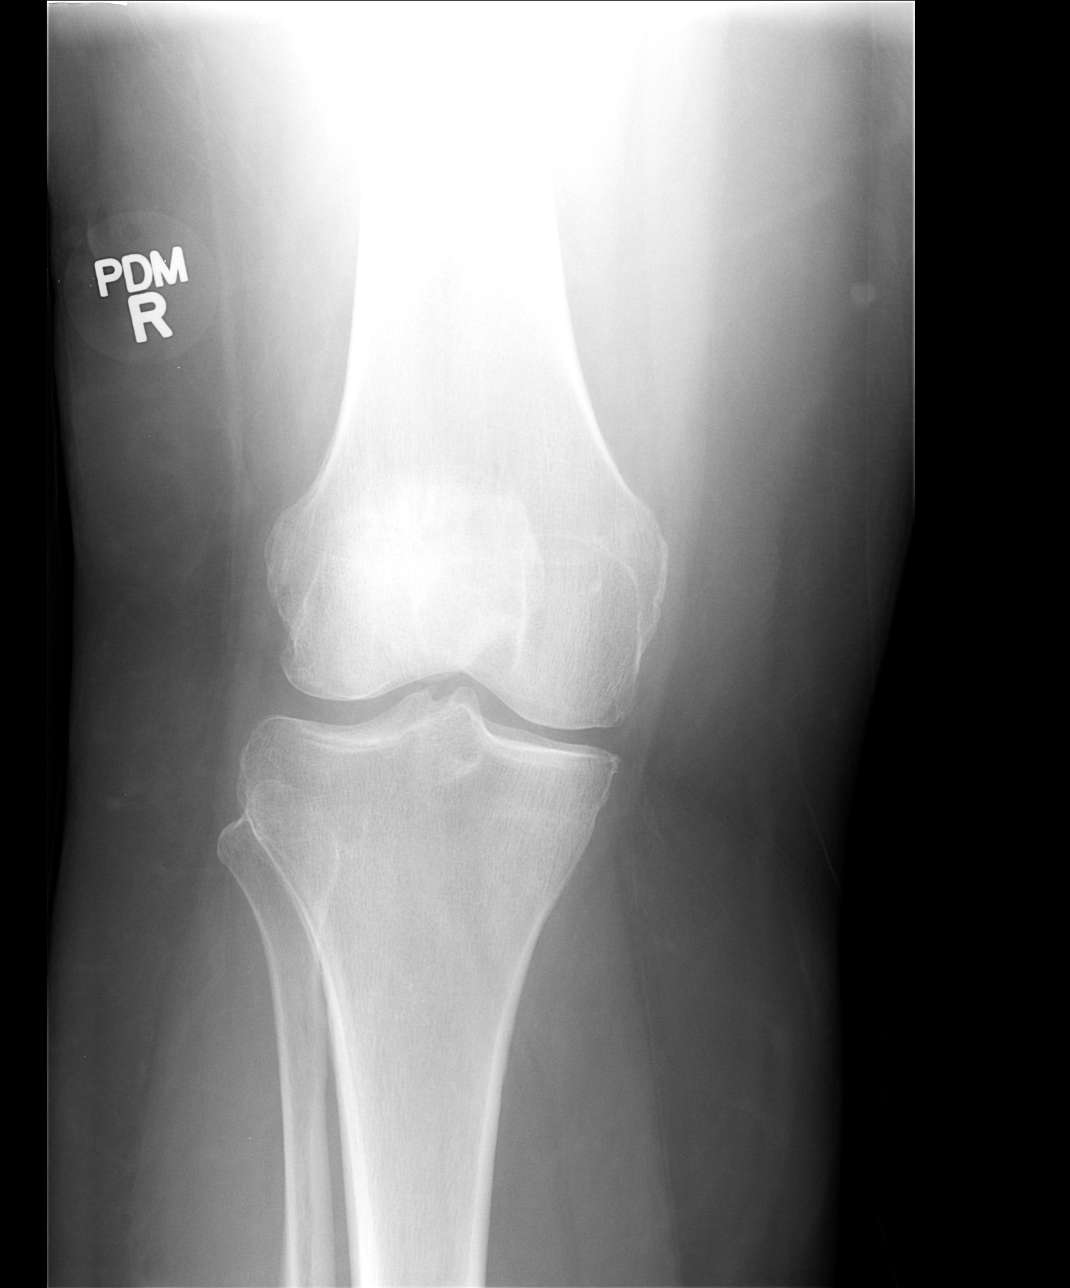

[view not recorded (3 of 4)]
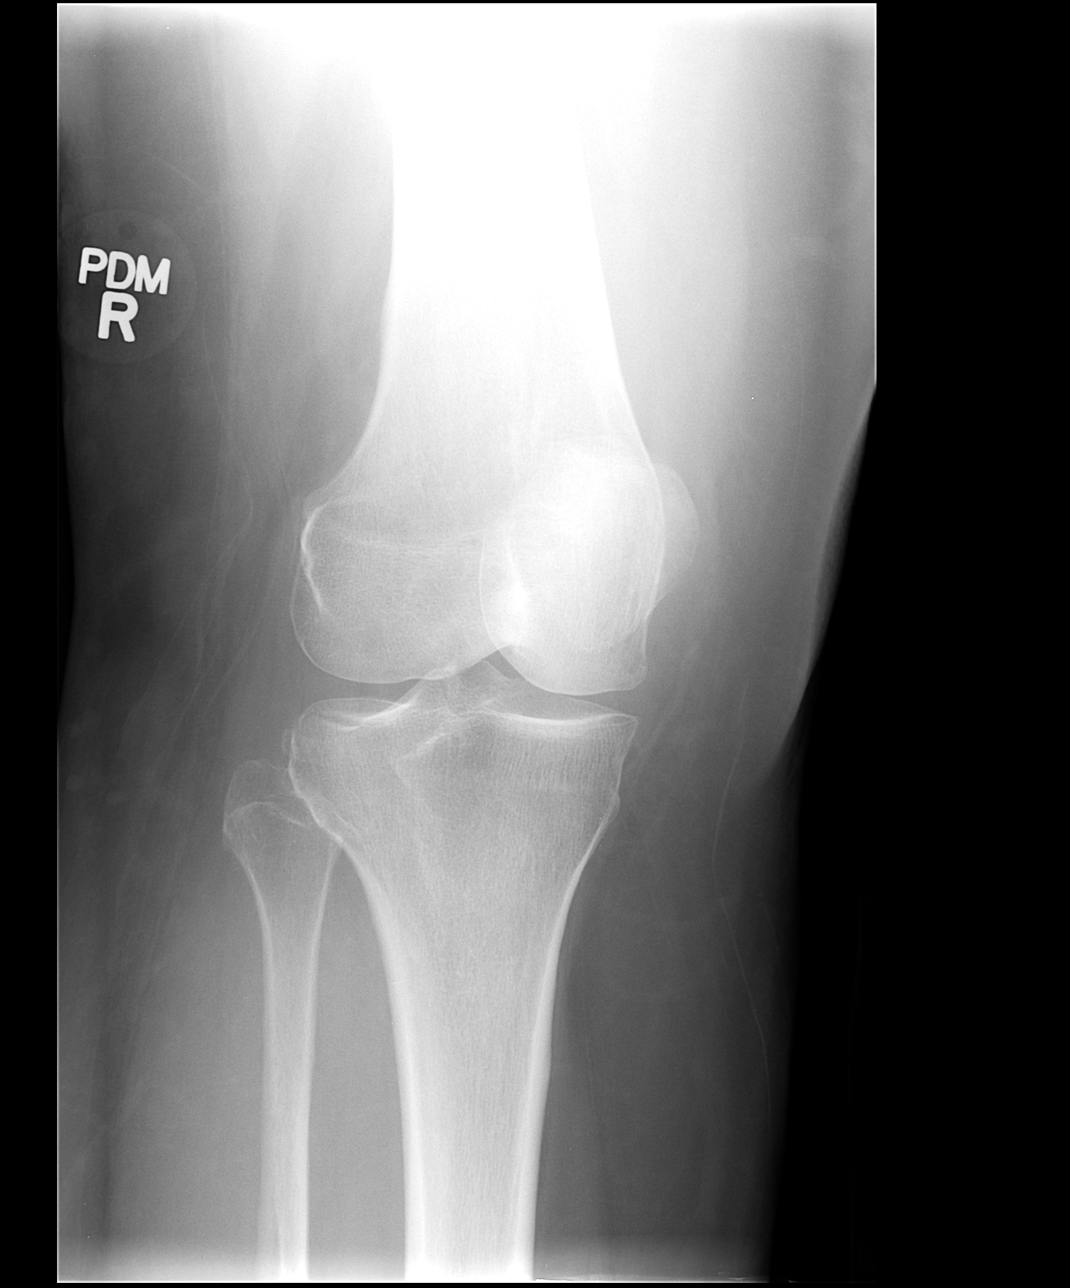

[view not recorded (4 of 4)]
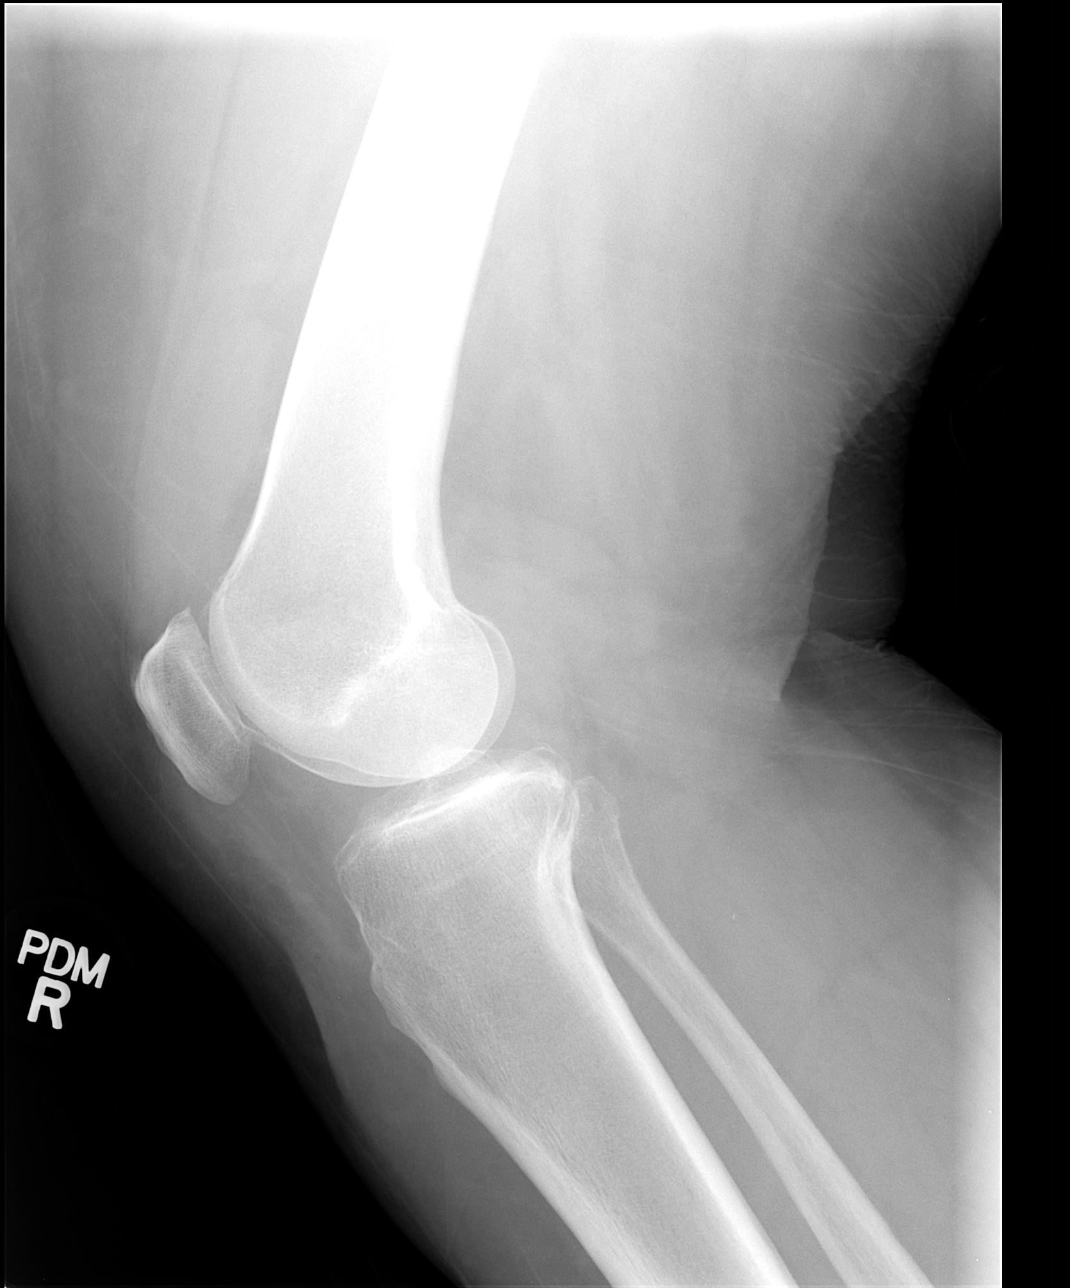

[4 of 4 positions shown; findings below may reference images not displayed]

FINDINGS: The bones are adequately mineralized. There is mild beaking of the
tibial spines. The joint spaces are preserved. There is no
chondrocalcinosis. There is a small spur from the periphery of the
medial femoral condyle. There is a tiny spur from the superior
articular margin of the patella. A small suprapatellar effusion is
suspected. The proximal fibula is intact.
IMPRESSION: There is mild degenerative change of the right knee. There is no
acute fracture. A small suprapatellar effusion is present.

## 2016-10-28 ENCOUNTER — Encounter: Payer: Self-pay | Admitting: Internal Medicine

## 2016-12-11 ENCOUNTER — Other Ambulatory Visit: Payer: Self-pay | Admitting: Gynecologic Oncology

## 2016-12-11 DIAGNOSIS — Z1231 Encounter for screening mammogram for malignant neoplasm of breast: Secondary | ICD-10-CM

## 2016-12-25 ENCOUNTER — Ambulatory Visit
Admission: RE | Admit: 2016-12-25 | Discharge: 2016-12-25 | Disposition: A | Discharge status: Home / Self Care | Payer: Medicare Other | Source: Ambulatory Visit | Attending: Gynecologic Oncology | Admitting: Gynecologic Oncology

## 2016-12-25 DIAGNOSIS — Z1231 Encounter for screening mammogram for malignant neoplasm of breast: Secondary | ICD-10-CM
